# Patient Record
Sex: Female | Born: 1995 | Race: White | Hispanic: No | Marital: Single | Smoking: Former smoker
Health system: Southern US, Community
[De-identification: ages and names within clinical notes are randomized; demographics above are authoritative.]

## PROBLEM LIST (undated history)

## (undated) ENCOUNTER — Inpatient Hospital Stay (HOSPITAL_COMMUNITY): Payer: Self-pay

## (undated) DIAGNOSIS — Z8719 Personal history of other diseases of the digestive system: Secondary | ICD-10-CM

## (undated) DIAGNOSIS — K56609 Unspecified intestinal obstruction, unspecified as to partial versus complete obstruction: Secondary | ICD-10-CM

## (undated) DIAGNOSIS — S02609A Fracture of mandible, unspecified, initial encounter for closed fracture: Secondary | ICD-10-CM

## (undated) DIAGNOSIS — K449 Diaphragmatic hernia without obstruction or gangrene: Secondary | ICD-10-CM

## (undated) DIAGNOSIS — J45909 Unspecified asthma, uncomplicated: Secondary | ICD-10-CM

## (undated) DIAGNOSIS — F419 Anxiety disorder, unspecified: Secondary | ICD-10-CM

## (undated) DIAGNOSIS — S36113A Laceration of liver, unspecified degree, initial encounter: Secondary | ICD-10-CM

## (undated) DIAGNOSIS — F1193 Opioid use, unspecified with withdrawal: Secondary | ICD-10-CM

## (undated) DIAGNOSIS — Z8781 Personal history of (healed) traumatic fracture: Secondary | ICD-10-CM

## (undated) DIAGNOSIS — K567 Ileus, unspecified: Secondary | ICD-10-CM

## (undated) DIAGNOSIS — K529 Noninfective gastroenteritis and colitis, unspecified: Secondary | ICD-10-CM

## (undated) DIAGNOSIS — S0219XD Other fracture of base of skull, subsequent encounter for fracture with routine healing: Secondary | ICD-10-CM

## (undated) DIAGNOSIS — F1910 Other psychoactive substance abuse, uncomplicated: Secondary | ICD-10-CM

## (undated) DIAGNOSIS — S42412D Displaced simple supracondylar fracture without intercondylar fracture of left humerus, subsequent encounter for fracture with routine healing: Secondary | ICD-10-CM

## (undated) DIAGNOSIS — S069X9A Unspecified intracranial injury with loss of consciousness of unspecified duration, initial encounter: Secondary | ICD-10-CM

## (undated) DIAGNOSIS — S52602D Unspecified fracture of lower end of left ulna, subsequent encounter for closed fracture with routine healing: Secondary | ICD-10-CM

## (undated) DIAGNOSIS — S52272B Monteggia's fracture of left ulna, initial encounter for open fracture type I or II: Secondary | ICD-10-CM

## (undated) DIAGNOSIS — H539 Unspecified visual disturbance: Secondary | ICD-10-CM

## (undated) DIAGNOSIS — F4310 Post-traumatic stress disorder, unspecified: Secondary | ICD-10-CM

## (undated) DIAGNOSIS — J95821 Acute postprocedural respiratory failure: Secondary | ICD-10-CM

## (undated) DIAGNOSIS — H9191 Unspecified hearing loss, right ear: Secondary | ICD-10-CM

## (undated) DIAGNOSIS — K56600 Partial intestinal obstruction, unspecified as to cause: Secondary | ICD-10-CM

## (undated) DIAGNOSIS — S35229A Unspecified injury of superior mesenteric artery, initial encounter: Secondary | ICD-10-CM

## (undated) DIAGNOSIS — S20212A Contusion of left front wall of thorax, initial encounter: Secondary | ICD-10-CM

## (undated) HISTORY — PX: HIP SURGERY: SHX245

## (undated) HISTORY — PX: SPLENECTOMY, TOTAL: SHX788

## (undated) HISTORY — PX: FRACTURE SURGERY: SHX138

---
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
## 2016-01-05 DIAGNOSIS — S0219XD Other fracture of base of skull, subsequent encounter for fracture with routine healing: Secondary | ICD-10-CM | POA: Insufficient documentation

## 2016-01-05 DIAGNOSIS — S02609A Fracture of mandible, unspecified, initial encounter for closed fracture: Secondary | ICD-10-CM

## 2016-01-05 HISTORY — DX: Fracture of mandible, unspecified, initial encounter for closed fracture: S02.609A

## 2016-01-05 HISTORY — DX: Other fracture of base of skull, subsequent encounter for fracture with routine healing: S02.19XD

## 2016-01-13 DIAGNOSIS — S52602D Unspecified fracture of lower end of left ulna, subsequent encounter for closed fracture with routine healing: Secondary | ICD-10-CM

## 2016-01-13 DIAGNOSIS — S52272B Monteggia's fracture of left ulna, initial encounter for open fracture type I or II: Secondary | ICD-10-CM

## 2016-01-13 DIAGNOSIS — S42412D Displaced simple supracondylar fracture without intercondylar fracture of left humerus, subsequent encounter for fracture with routine healing: Secondary | ICD-10-CM | POA: Insufficient documentation

## 2016-01-13 HISTORY — DX: Monteggia's fracture of left ulna, initial encounter for open fracture type I or II: S52.272B

## 2016-01-13 HISTORY — DX: Displaced simple supracondylar fracture without intercondylar fracture of left humerus, subsequent encounter for fracture with routine healing: S42.412D

## 2016-01-13 HISTORY — DX: Unspecified fracture of lower end of left ulna, subsequent encounter for closed fracture with routine healing: S52.602D

## 2016-01-27 DIAGNOSIS — R4189 Other symptoms and signs involving cognitive functions and awareness: Secondary | ICD-10-CM | POA: Insufficient documentation

## 2016-01-27 DIAGNOSIS — R29818 Other symptoms and signs involving the nervous system: Secondary | ICD-10-CM | POA: Insufficient documentation

## 2016-02-07 HISTORY — PX: EXPLORATORY LAPAROTOMY: SUR591

## 2016-12-09 ENCOUNTER — Emergency Department (HOSPITAL_COMMUNITY)
Admission: EM | Admit: 2016-12-09 | Discharge: 2016-12-09 | Disposition: A | Discharge status: Other Acute Inpatient Hospital | Payer: Medicaid Other

## 2017-01-07 ENCOUNTER — Emergency Department (HOSPITAL_BASED_OUTPATIENT_CLINIC_OR_DEPARTMENT_OTHER)
Admission: EM | Admit: 2017-01-07 | Discharge: 2017-01-08 | Disposition: A | Discharge status: Home / Self Care | Payer: Medicaid Other | Attending: Emergency Medicine | Admitting: Emergency Medicine

## 2017-01-07 ENCOUNTER — Other Ambulatory Visit: Payer: Self-pay

## 2017-01-07 ENCOUNTER — Encounter (HOSPITAL_BASED_OUTPATIENT_CLINIC_OR_DEPARTMENT_OTHER): Payer: Self-pay | Admitting: Emergency Medicine

## 2017-01-07 DIAGNOSIS — F17210 Nicotine dependence, cigarettes, uncomplicated: Secondary | ICD-10-CM | POA: Diagnosis not present

## 2017-01-07 DIAGNOSIS — Z79899 Other long term (current) drug therapy: Secondary | ICD-10-CM | POA: Insufficient documentation

## 2017-01-07 DIAGNOSIS — M79602 Pain in left arm: Secondary | ICD-10-CM | POA: Diagnosis not present

## 2017-01-07 DIAGNOSIS — J45909 Unspecified asthma, uncomplicated: Secondary | ICD-10-CM | POA: Diagnosis not present

## 2017-01-07 NOTE — ED Triage Notes (Signed)
PT presents with c/o generalized body aches since Nov of last year

## 2017-01-07 NOTE — ED Notes (Signed)
EDPA into room, prior to RN assessment, see PA notes, pending orders.   

## 2017-01-08 MED ORDER — CYCLOBENZAPRINE HCL 10 MG PO TABS
10.0000 mg | ORAL_TABLET | Freq: Once | ORAL | Status: AC
  Administered 2017-01-08: 10 mg via ORAL
  Filled 2017-01-08: qty 1

## 2017-01-08 MED ORDER — CYCLOBENZAPRINE HCL 10 MG PO TABS: 10.0000 mg | ORAL_TABLET | Freq: Two times a day (BID) | ORAL | 0 refills | Status: DC | PRN

## 2017-01-08 MED ORDER — KETOROLAC TROMETHAMINE 60 MG/2ML IM SOLN
30.0000 mg | Freq: Once | INTRAMUSCULAR | Status: AC
  Administered 2017-01-08: 30 mg via INTRAMUSCULAR
  Filled 2017-01-08: qty 2

## 2017-01-08 NOTE — Discharge Instructions (Signed)
Please read and follow all provided instructions.  Your diagnoses today include:  1. Left arm pain     Tests performed today include:  Vital signs. See below for your results today.   Medications prescribed:   Flexeril (cyclobenzaprine) - muscle relaxer medication  DO NOT drive or perform any activities that require you to be awake and alert because this medicine can make you drowsy.   Take any prescribed medications only as directed.  Home care instructions:   Follow any educational materials contained in this packet  Follow R.I.C.E. Protocol:  R - rest your injury   I  - use ice on injury without applying directly to skin  C - compress injury with bandage or splint  E - elevate the injury as much as possible  Follow-up instructions: Please follow-up with your primary care provider as needed.   Return instructions:   Please return if your fingers are numb or tingling, appear gray or blue, or you have severe pain (also elevate the arm and loosen splint or wrap if you were given one)  Please return to the Emergency Department if you experience worsening symptoms.   Please return if you have any other emergent concerns.  Additional Information:  Your vital signs today were: BP 119/80 (BP Location: Left Arm)    Pulse 83    Temp 98.3 F (36.8 C) (Oral)    Resp 18    LMP 12/25/2016    SpO2 100%  If your blood pressure (BP) was elevated above 135/85 this visit, please have this repeated by your doctor within one month. --------------

## 2017-01-08 NOTE — ED Provider Notes (Signed)
[facility] EMERGENCY DEPARTMENT Provider Note   CSN: [id number] Arrival date & time: 01/07/17  2350     History   Chief Complaint Chief Complaint  Patient presents with  . Generalized Body Aches    HPI Deborah Boone is a 21 y.o. female.  Patient with history of traumatic left arm injury 1 year ago, several plates and screws placed in her arm, presents with acute on chronic pain worse over the past several days.  Patient relates the worsening pain to the cold weather.  She has generalized pain in her left shoulder, elbow, and wrist without swelling.  No new injuries.  No weakness, numbness or tingling.  Patient is taking over-the-counter medications at home, gabapentin, meloxicam without relief.  Nothing makes the pain better or worse.       Past Medical History:  Diagnosis Date  . Anxiety   . Asthma   . Hearing loss in right ear    Pt reports hearing loss in right ear  . PTSD (post-traumatic stress disorder)   . Small bowel obstruction (HCC)   . Vision abnormalities    Pt wears glasses    Patient Active Problem List   Diagnosis Date Noted  . Polysubstance dependence including opioid type drug without complication, episodic abuse (HCC) 09/01/2015  . MDD (major depressive disorder) 08/31/2015  . Ileus (HCC) 08/15/2015  . Partial small bowel obstruction 07/20/2015  . Anxiety   . Asthma   . Hearing loss in right ear   . Enteritis 07/18/2015  . Chronic post-traumatic stress disorder (PTSD) 03/14/2014  . Opioid use with withdrawal (HCC) 03/12/2014  . Polysubstance (including opioids) dependence with physiological dependence (HCC) 03/12/2014    History reviewed. No pertinent surgical history.  OB History    No data available       Home Medications    Prior to Admission medications   Medication Sig Start Date End Date Taking? Authorizing Provider  albuterol (PROVENTIL HFA;VENTOLIN HFA) 108 (90 Base) MCG/ACT inhaler Inhale 2 puffs into the lungs  every 4 (four) hours as needed for wheezing or shortness of breath. 09/08/15   Armandina StammerNwoko, Agnes I, NP  hydrOXYzine (ATARAX/VISTARIL) 25 MG tablet Take 1 tablet (25 mg total) by mouth every 6 (six) hours as needed for anxiety. 09/08/15   Armandina StammerNwoko, Agnes I, NP  lamoTRIgine (LAMICTAL) 25 MG tablet Take 1 tablet (25 mg total) by mouth daily. Mood stabilization 09/08/15   Nwoko, Nicole KindredAgnes I, NP  nicotine (NICODERM CQ - DOSED IN MG/24 HOURS) 21 mg/24hr patch Place 1 patch (21 mg total) onto the skin daily. For smoking cessation 09/08/15   Armandina StammerNwoko, Agnes I, NP  traZODone (DESYREL) 50 MG tablet Take 1 tablet (50 mg total) by mouth at bedtime as needed for sleep. 09/08/15   Sanjuana KavaNwoko, Agnes I, NP    Family History No family history on file.  Social History Social History   Tobacco Use  . Smoking status: Current Every Day Smoker    Packs/day: 1.00    Years: 5.00    Pack years: 5.00    Types: Cigarettes  . Smokeless tobacco: Never Used  Substance Use Topics  . Alcohol use: Yes    Comment: 4-5X PER WEEK  . Drug use: Yes    Types: Cocaine, Heroin, Hydrocodone, Oxycodone, Marijuana, Benzodiazepines, Other-see comments, Methamphetamines    Comment: heroine, xanax, crack     Allergies   Patient has no known allergies.   Review of Systems Review of Systems  Constitutional: Negative for  activity change.  Musculoskeletal: Positive for arthralgias and myalgias. Negative for back pain, joint swelling and neck pain.  Skin: Negative for wound.  Neurological: Negative for weakness and numbness.     Physical Exam Updated Vital Signs BP 119/80 (BP Location: Left Arm)   Pulse 83   Temp 98.3 F (36.8 C) (Oral)   Resp 18   LMP 12/25/2016   SpO2 100%   Physical Exam  Constitutional: She appears well-developed and well-nourished.  HENT:  Head: Normocephalic and atraumatic.  Eyes: Conjunctivae are normal.  Neck: Normal range of motion. Neck supple.  Pulmonary/Chest: No respiratory distress.  Musculoskeletal:    Patient with mild generalized tenderness of the left upper extremity.  Postsurgical changes noted.  Full range of motion of the hand, wrist, elbow, shoulder.  No effusions or cellulitis noted.  2+ radial pulse.  Normal capillary refill.  Distal sensation is normal.  No tenderness or swelling over the cervical or thoracic spines, upper back.  Neurological: She is alert.  Skin: Skin is warm and dry.  Psychiatric: She has a normal mood and affect.  Nursing note and vitals reviewed.    ED Treatments / Results   Procedures Procedures (including critical care time)  Medications Ordered in ED Medications  ketorolac (TORADOL) injection 30 mg (not administered)  cyclobenzaprine (FLEXERIL) tablet 10 mg (not administered)     Initial Impression / Assessment and Plan / ED Course  I have reviewed the triage vital signs and the nursing notes.  Pertinent labs & imaging results that were available during my care of the patient were reviewed by me and considered in my medical decision making (see chart for details).     Patient seen and examined. No acute injuries, infections, or other emergent problems noted on exam.  Upper extremities neurovascularly intact.  Will give IM Toradol and a muscle relaxer as the patient describes some spasming in her arm at times.  Patient counseled on proper use of muscle relaxant medication.  They were told not to drink alcohol, drive any vehicle, or do any dangerous activities while taking this medication.  Patient verbalized understanding.  Vital signs reviewed and are as follows: BP 119/80 (BP Location: Left Arm)   Pulse 83   Temp 98.3 F (36.8 C) (Oral)   Resp 18   LMP 12/25/2016   SpO2 100%     Final Clinical Impressions(s) / ED Diagnoses   Final diagnoses:  Left arm pain   Patient with acute on chronic left arm pain.  Treated with anti-inflammatories and muscle relaxer here.  Home with muscle relaxer.  She has meloxicam at home.  Encouraged  follow-up with her primary care physician as needed.  No signs of ischemia, infection, effusion on exam here.  Good range of motion.  Do not suspect joint infection.  ED Discharge Orders        Ordered    cyclobenzaprine (FLEXERIL) 10 MG tablet  2 times daily PRN     01/08/17 0035       Renne CriglerGeiple, Destinie Thornsberry, PA-C 01/08/17 0037    Molpus, Jonny RuizJohn, MD 01/08/17 [phone]

## 2017-01-08 NOTE — ED Notes (Signed)
Alert, NAD, calm, interactive, resps e/u, speaking in clear complete sentences, no dyspnea noted, skin W&D, initial VSS, c/o chronic L sided arm pain, and general L sided aches, for ~ 2 years since accident involving surgery and hardware, mentions some tingling r/t nerve damage (denies: recent sickness, sob, NVD, fever, dizziness or visual changes). [facility] H/o surgery and PT.

## 2017-01-08 NOTE — ED Notes (Signed)
Pt given d/c instructions as per chart. Rx x 1 with precautions. Verbalizes understanding. No questions. 

## 2017-12-21 IMAGING — CT CT ABD-PELV W/ CM
2 of 4 series · 15 of 46 positions shown, 17 images · IV contrast (APPLIED)
Comparison: 07/17/2015

CLINICAL DATA: Gradual onset right lower quadrant of abdominal pain
and right flank pain for 1 week. Fever, dysuria, hematuria,
dizziness, right chest pain.

EXAM:
CT ABDOMEN AND PELVIS WITH CONTRAST
TECHNIQUE: Multidetector CT imaging of the abdomen and pelvis was performed
using the standard protocol following bolus administration of
intravenous contrast.
CONTRAST:  100mL K0OYAI-YBB IOPAMIDOL (K0OYAI-YBB) INJECTION 61%

[Series 2: axial st · axial · 0.68mm/px · z∈[-582,-212]mm · 12 of 88 slices shown, 14 images]
[im 7/88  soft-tissue]
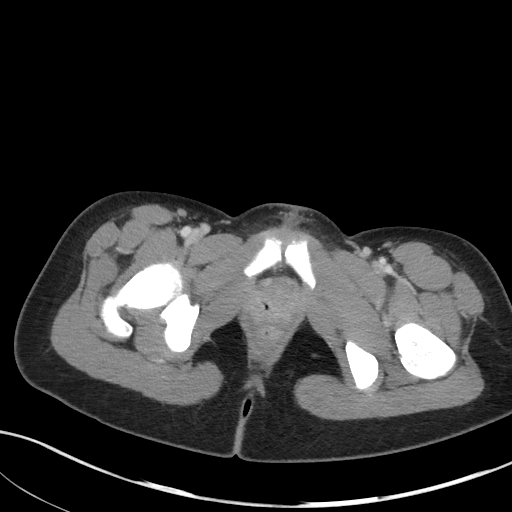
[im 7/88  bone]
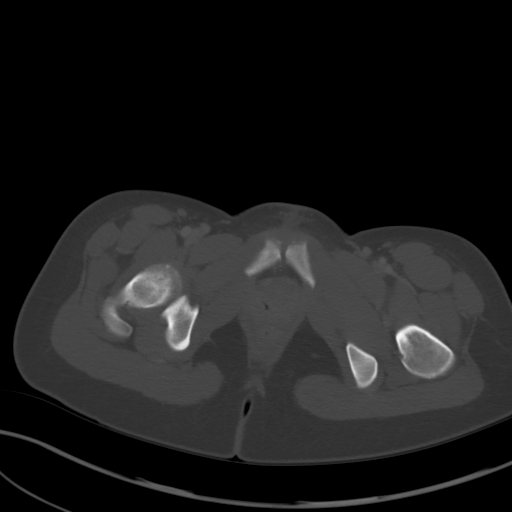
[im 14/88  soft-tissue]
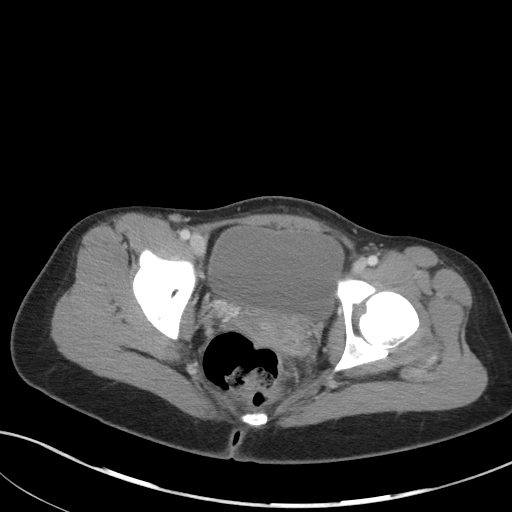
[im 21/88  soft-tissue]
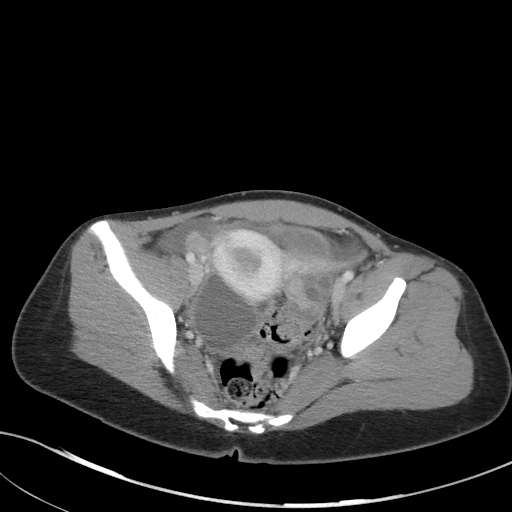
[im 27/88  soft-tissue]
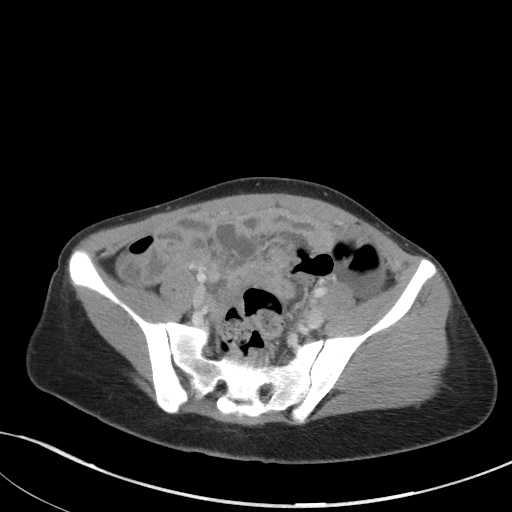
[im 34/88  soft-tissue]
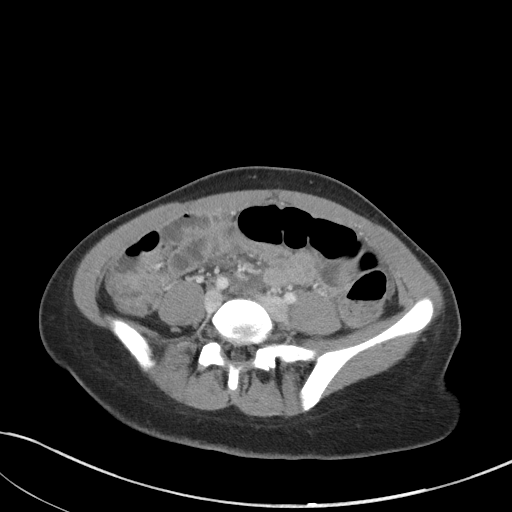
[im 41/88  soft-tissue]
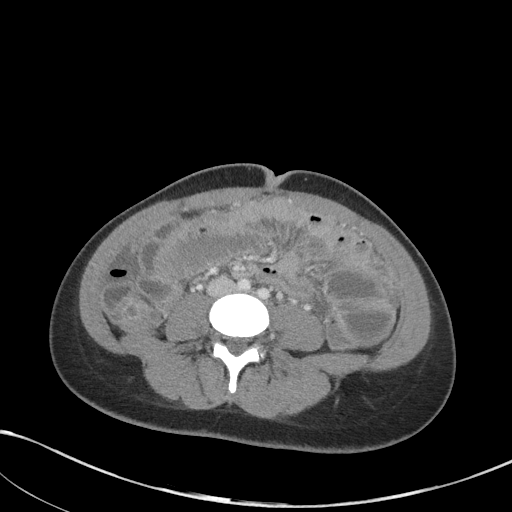
[im 47/88  soft-tissue]
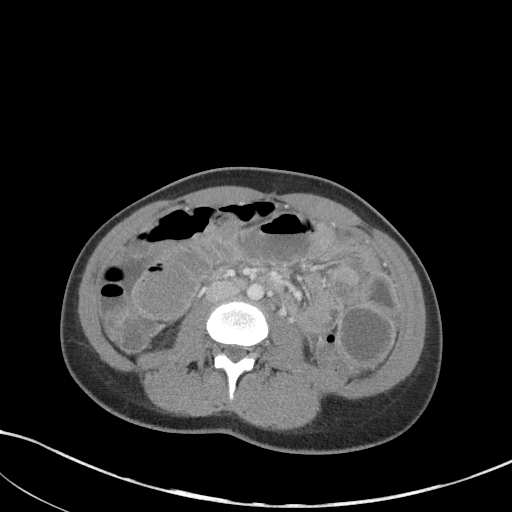
[im 54/88  soft-tissue]
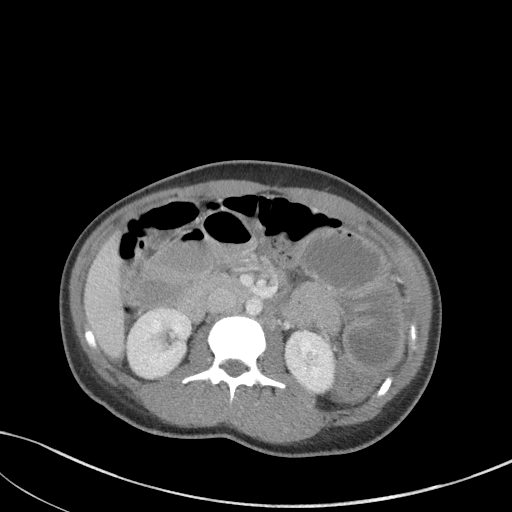
[im 61/88  soft-tissue]
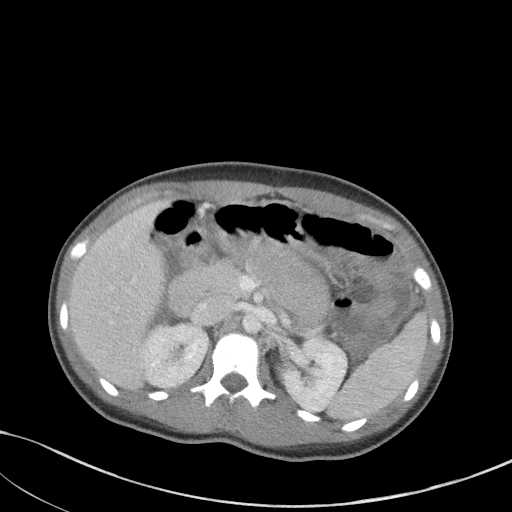
[im 61/88  bone]
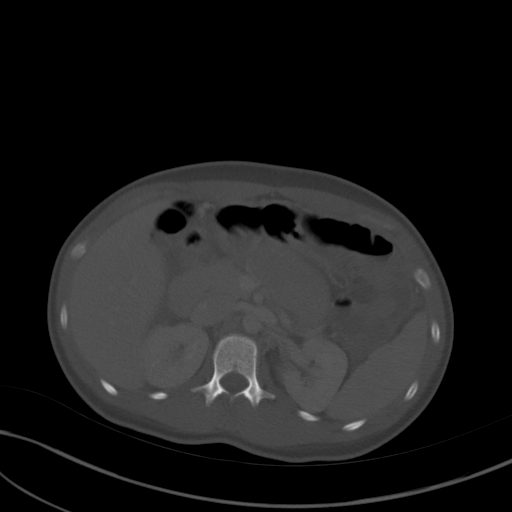
[im 67/88  soft-tissue]
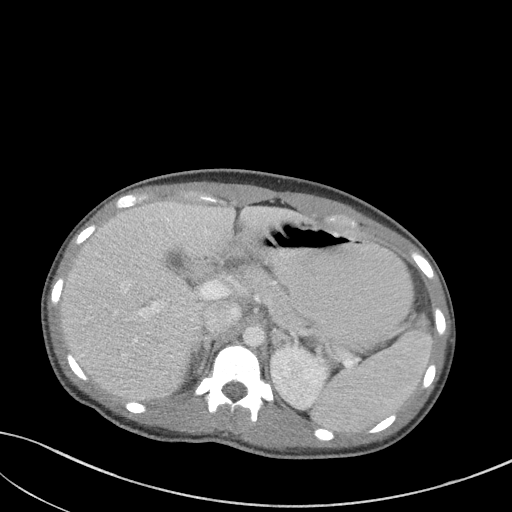
[im 74/88  soft-tissue]
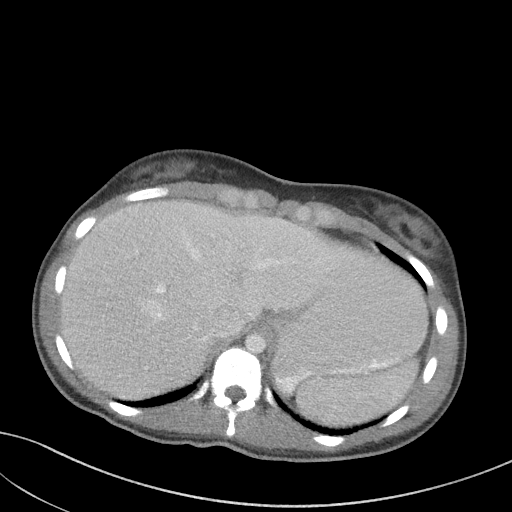
[im 81/88  soft-tissue]
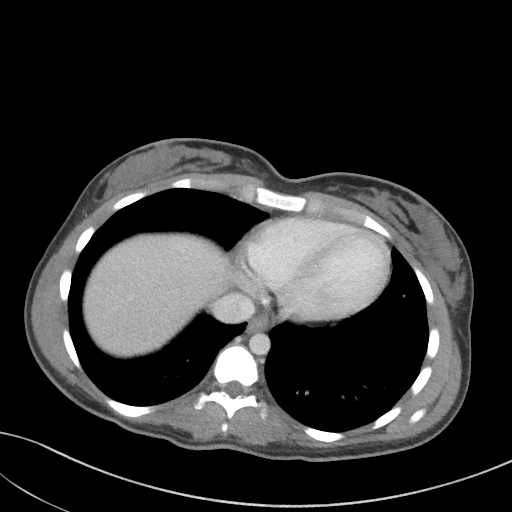

[Series 5: coronal st · coronal · 0.55mm/px · 3 of 77 slices shown]
[im 26/77  soft-tissue]
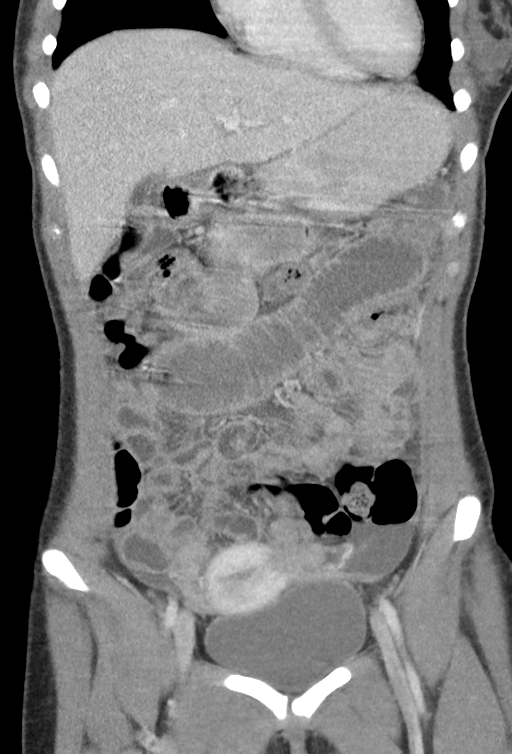
[im 34/77  soft-tissue]
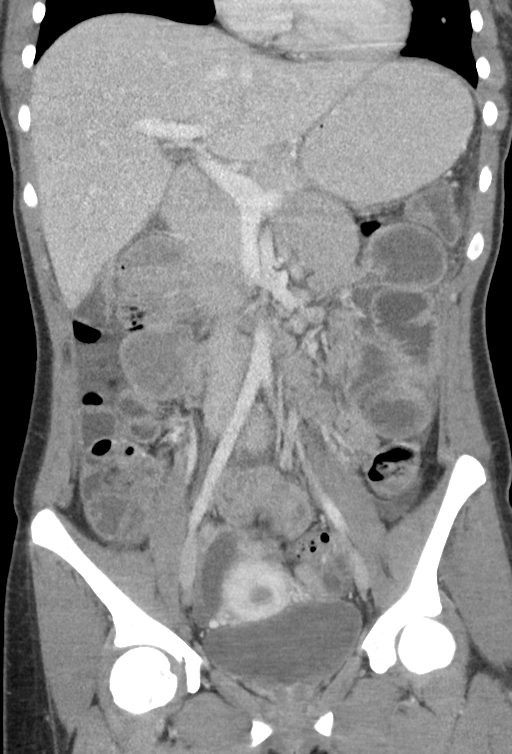
[im 43/77  soft-tissue]
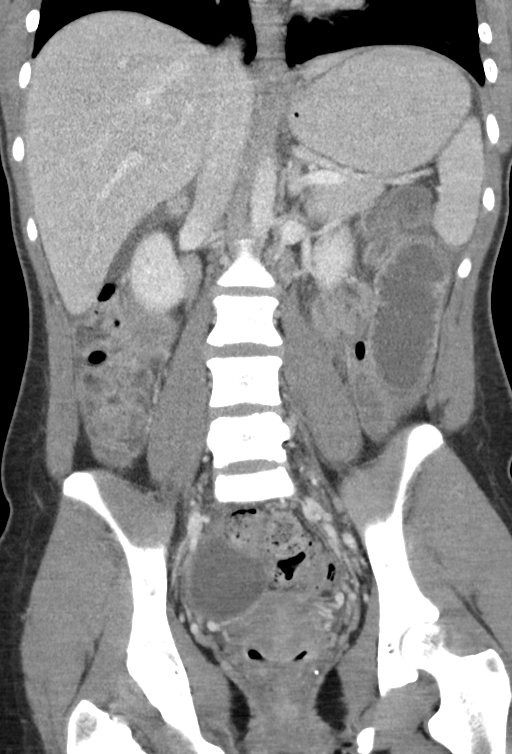

[15 of 46 positions shown; findings below may reference images not displayed]

FINDINGS: The lung bases are clear.

The liver, spleen, gallbladder, pancreas, adrenal glands, kidneys,
abdominal aorta, inferior vena cava are unremarkable. Mild
prominence of retroperitoneal lymph nodes, likely reactive. Stomach
is not abnormally distended. There is diffuse distention of
fluid-filled small bowel with decompression of the ileum. Transition
zone is nonspecific but probably in the left upper quadrant. There
is evidence of some wall thickening in the dilated small bowel and
there is free fluid in the mesenteric. Findings could represent
superimposed enteritis. Ischemia possible but less likely as there
is no pneumatosis or portal venous gas. Cause of obstruction is not
identified. No free air in the abdomen.

Pelvis: Bladder wall is not thickened. Uterus is not enlarged. There
is a right ovarian cyst measuring 4.7 cm diameter. This appears to
be a simple cyst. Based on size and premenopausal age, no follow-up
is needed. No free or loculated pelvic fluid collections. No pelvic
mass or lymphadenopathy. Bold no destructive bone lesions.
IMPRESSION: Prominent distended fluid-filled small bowel with wall thickening.
Decompression of the ileum. Mild mesenteric edema. Changes are
consistent with small bowel obstruction and enteritis. Cause of
obstruction is not identified. Transition zone appears to be in the
left abdomen.

## 2017-12-24 IMAGING — DX DG ABDOMEN 2V
2 series · 2 of 2 positions shown · non-contrast
Comparison: 07/20/2015

CLINICAL DATA: Small-bowel obstruction subsequent encounter

EXAM:
ABDOMEN - 2 VIEW

[abdomen erect]
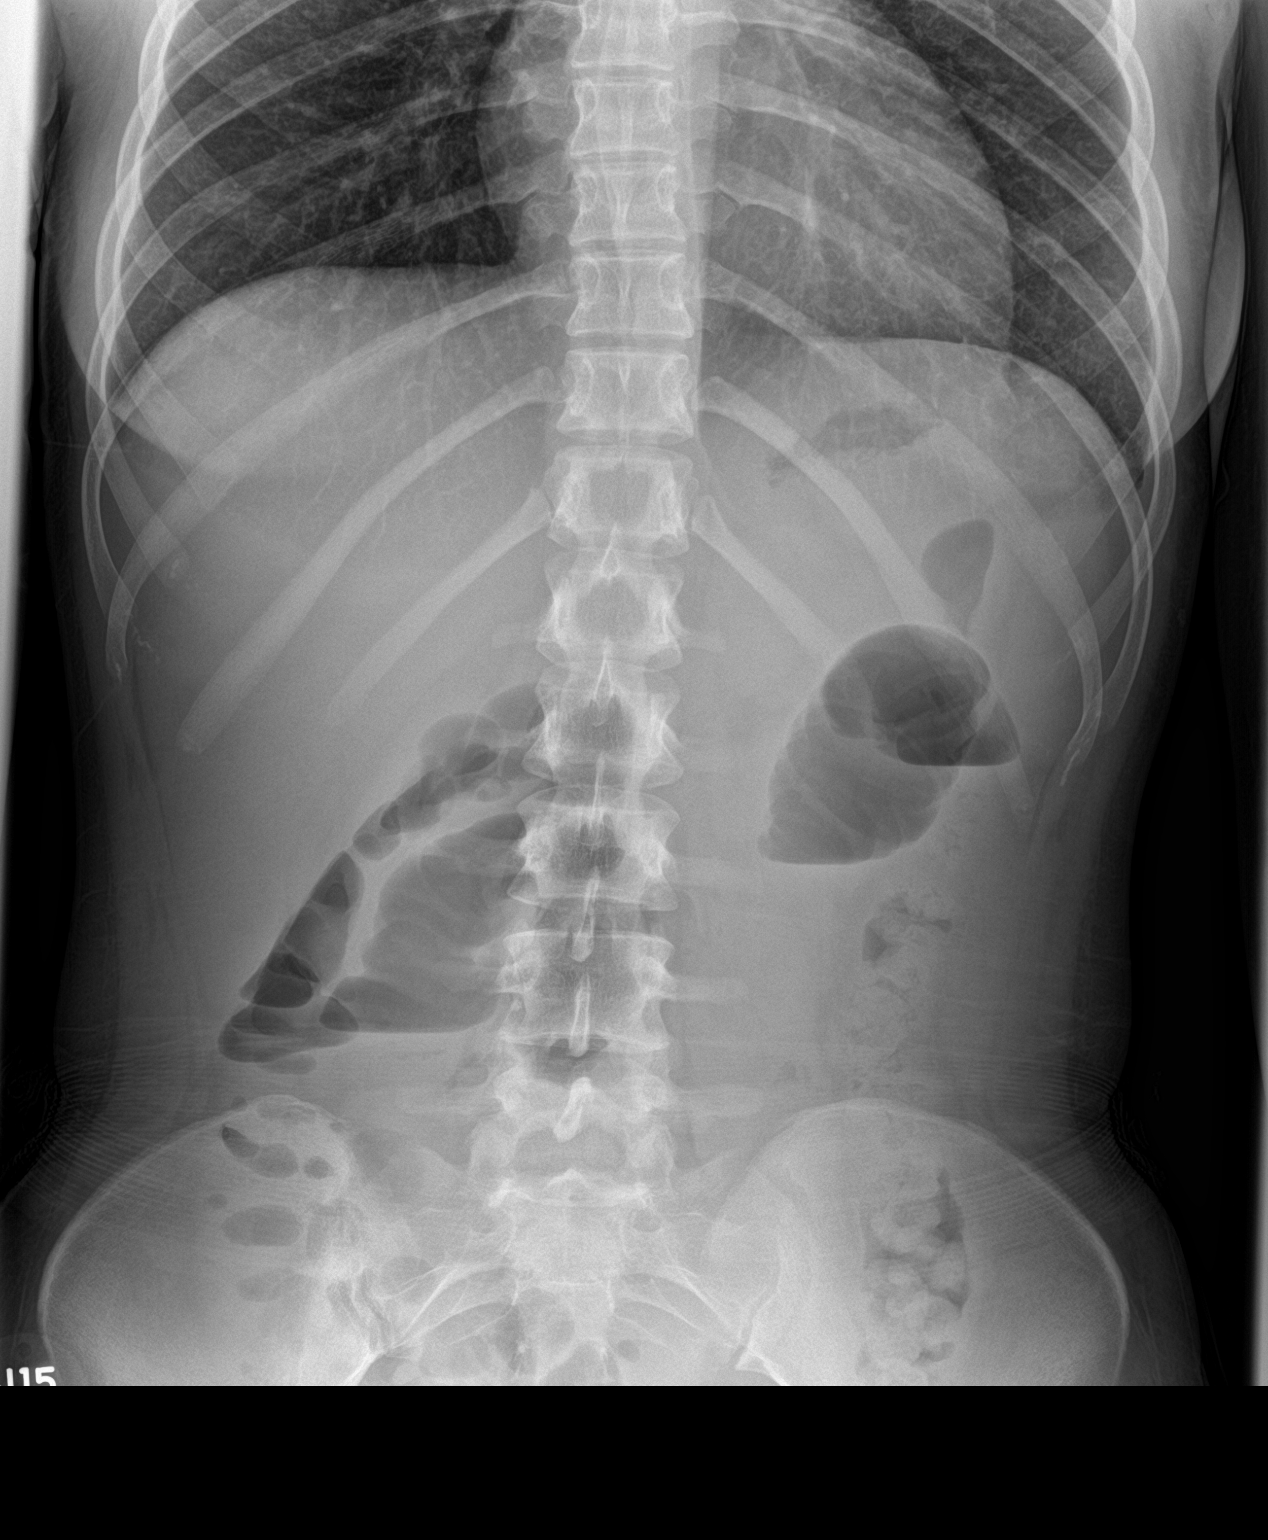

[abdomen supine]
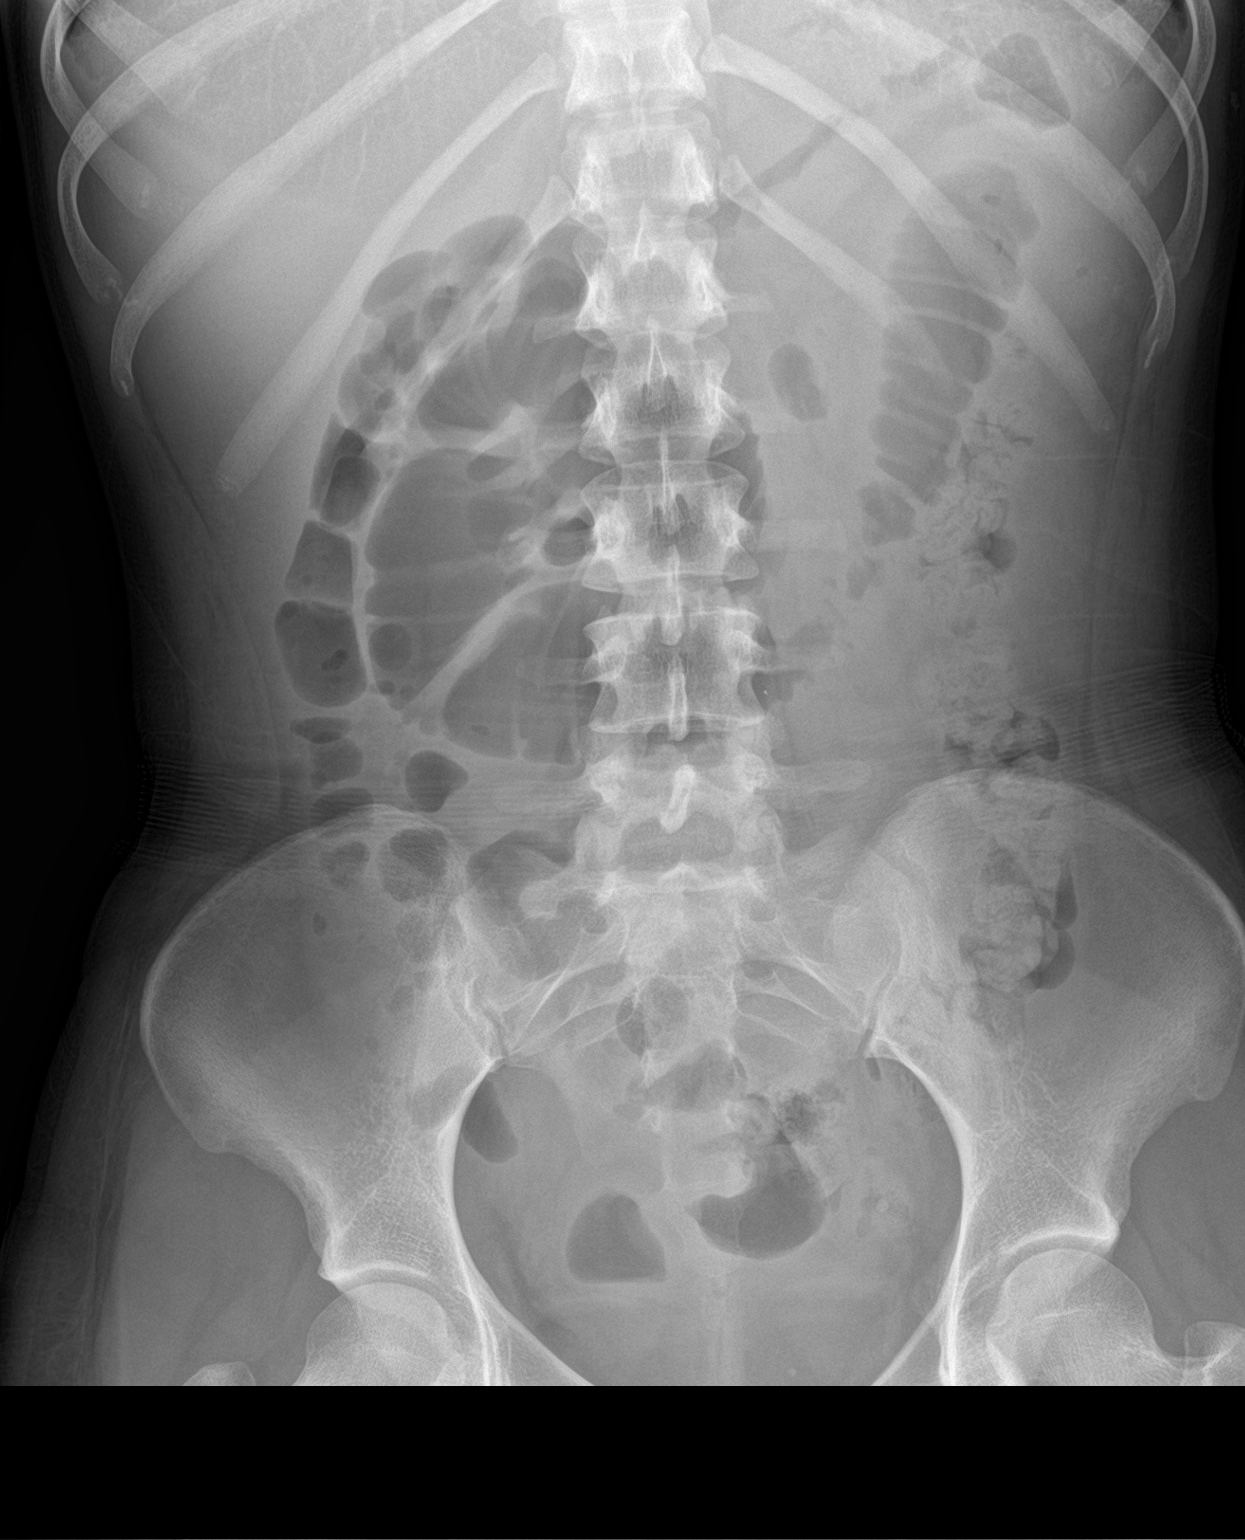

[2 of 2 positions shown; findings below may reference images not displayed]

FINDINGS: No evidence of free air. Small bowel air-fluid levels again
identified. Also again identified is the presence of dilatation
involving numerous loops of mid abdominal small bowel. Maximum
diameter involves a loop in the left upper quadrant in measures
cm. The colon is decompressed. There is a small volume of gas into
the large bowel.
IMPRESSION: Persistent small bowel dilatation with air-fluid levels consistent
with small bowel obstruction

## 2018-01-25 ENCOUNTER — Emergency Department (HOSPITAL_COMMUNITY)
Admission: EM | Admit: 2018-01-25 | Discharge: 2018-01-26 | Disposition: A | Discharge status: Other Acute Inpatient Hospital | Payer: Medicaid Other | Source: Home / Self Care | Attending: Emergency Medicine | Admitting: Emergency Medicine

## 2018-01-25 ENCOUNTER — Emergency Department (HOSPITAL_COMMUNITY)
Admission: EM | Admit: 2018-01-25 | Discharge: 2018-01-25 | Disposition: A | Discharge status: Home / Self Care | Payer: Medicaid Other | Attending: Emergency Medicine | Admitting: Emergency Medicine

## 2018-01-25 ENCOUNTER — Encounter (HOSPITAL_COMMUNITY): Payer: Self-pay | Admitting: Emergency Medicine

## 2018-01-25 ENCOUNTER — Emergency Department (HOSPITAL_COMMUNITY): Payer: Medicaid Other

## 2018-01-25 ENCOUNTER — Other Ambulatory Visit: Payer: Self-pay

## 2018-01-25 DIAGNOSIS — F17210 Nicotine dependence, cigarettes, uncomplicated: Secondary | ICD-10-CM

## 2018-01-25 DIAGNOSIS — E860 Dehydration: Secondary | ICD-10-CM | POA: Diagnosis not present

## 2018-01-25 DIAGNOSIS — O2391 Unspecified genitourinary tract infection in pregnancy, first trimester: Secondary | ICD-10-CM | POA: Insufficient documentation

## 2018-01-25 DIAGNOSIS — F1520 Other stimulant dependence, uncomplicated: Secondary | ICD-10-CM | POA: Insufficient documentation

## 2018-01-25 DIAGNOSIS — F1120 Opioid dependence, uncomplicated: Secondary | ICD-10-CM

## 2018-01-25 DIAGNOSIS — F1220 Cannabis dependence, uncomplicated: Secondary | ICD-10-CM

## 2018-01-25 DIAGNOSIS — O9989 Other specified diseases and conditions complicating pregnancy, childbirth and the puerperium: Secondary | ICD-10-CM | POA: Diagnosis present

## 2018-01-25 DIAGNOSIS — O99331 Smoking (tobacco) complicating pregnancy, first trimester: Secondary | ICD-10-CM | POA: Insufficient documentation

## 2018-01-25 DIAGNOSIS — J45909 Unspecified asthma, uncomplicated: Secondary | ICD-10-CM | POA: Diagnosis not present

## 2018-01-25 DIAGNOSIS — R55 Syncope and collapse: Secondary | ICD-10-CM | POA: Diagnosis not present

## 2018-01-25 DIAGNOSIS — Y929 Unspecified place or not applicable: Secondary | ICD-10-CM | POA: Insufficient documentation

## 2018-01-25 DIAGNOSIS — O99511 Diseases of the respiratory system complicating pregnancy, first trimester: Secondary | ICD-10-CM

## 2018-01-25 DIAGNOSIS — R8271 Bacteriuria: Secondary | ICD-10-CM

## 2018-01-25 DIAGNOSIS — Y939 Activity, unspecified: Secondary | ICD-10-CM | POA: Diagnosis not present

## 2018-01-25 DIAGNOSIS — S0990XA Unspecified injury of head, initial encounter: Secondary | ICD-10-CM | POA: Diagnosis not present

## 2018-01-25 DIAGNOSIS — F1420 Cocaine dependence, uncomplicated: Secondary | ICD-10-CM | POA: Insufficient documentation

## 2018-01-25 DIAGNOSIS — O99519 Diseases of the respiratory system complicating pregnancy, unspecified trimester: Secondary | ICD-10-CM | POA: Insufficient documentation

## 2018-01-25 DIAGNOSIS — O99321 Drug use complicating pregnancy, first trimester: Secondary | ICD-10-CM | POA: Insufficient documentation

## 2018-01-25 DIAGNOSIS — O99341 Other mental disorders complicating pregnancy, first trimester: Secondary | ICD-10-CM | POA: Insufficient documentation

## 2018-01-25 DIAGNOSIS — F1910 Other psychoactive substance abuse, uncomplicated: Secondary | ICD-10-CM

## 2018-01-25 DIAGNOSIS — O99320 Drug use complicating pregnancy, unspecified trimester: Secondary | ICD-10-CM | POA: Diagnosis not present

## 2018-01-25 DIAGNOSIS — F332 Major depressive disorder, recurrent severe without psychotic features: Secondary | ICD-10-CM

## 2018-01-25 DIAGNOSIS — Y999 Unspecified external cause status: Secondary | ICD-10-CM | POA: Insufficient documentation

## 2018-01-25 DIAGNOSIS — Z3A01 Less than 8 weeks gestation of pregnancy: Secondary | ICD-10-CM | POA: Insufficient documentation

## 2018-01-25 DIAGNOSIS — F17290 Nicotine dependence, other tobacco product, uncomplicated: Secondary | ICD-10-CM | POA: Insufficient documentation

## 2018-01-25 DIAGNOSIS — R45851 Suicidal ideations: Secondary | ICD-10-CM

## 2018-01-25 DIAGNOSIS — M542 Cervicalgia: Secondary | ICD-10-CM | POA: Insufficient documentation

## 2018-01-25 DIAGNOSIS — O99330 Smoking (tobacco) complicating pregnancy, unspecified trimester: Secondary | ICD-10-CM | POA: Insufficient documentation

## 2018-01-25 HISTORY — DX: Other psychoactive substance abuse, uncomplicated: F19.10

## 2018-01-25 LAB — CBC WITH DIFFERENTIAL/PLATELET
Abs Immature Granulocytes: 0.05 10*3/uL (ref 0.00–0.07)
Abs Immature Granulocytes: 0.12 10*3/uL — ABNORMAL HIGH (ref 0.00–0.07)
BASOS PCT: 0 %
Basophils Absolute: 0 10*3/uL (ref 0.0–0.1)
Basophils Absolute: 0.1 10*3/uL (ref 0.0–0.1)
Basophils Relative: 0 %
EOS PCT: 1 %
Eosinophils Absolute: 0.2 10*3/uL (ref 0.0–0.5)
Eosinophils Absolute: 0.2 10*3/uL (ref 0.0–0.5)
Eosinophils Relative: 1 %
HCT: 37.4 % (ref 36.0–46.0)
HCT: 35 % — ABNORMAL LOW (ref 36.0–46.0)
HEMOGLOBIN: 11.5 g/dL — AB (ref 12.0–15.0)
Hemoglobin: 10.8 g/dL — ABNORMAL LOW (ref 12.0–15.0)
IMMATURE GRANULOCYTES: 1 %
Immature Granulocytes: 0 %
LYMPHS PCT: 29 %
Lymphocytes Relative: 29 %
Lymphs Abs: 4.8 10*3/uL — ABNORMAL HIGH (ref 0.7–4.0)
Lymphs Abs: 5.6 10*3/uL — ABNORMAL HIGH (ref 0.7–4.0)
MCH: 23.8 pg — AB (ref 26.0–34.0)
MCH: 24.2 pg — ABNORMAL LOW (ref 26.0–34.0)
MCHC: 30.7 g/dL (ref 30.0–36.0)
MCHC: 30.9 g/dL (ref 30.0–36.0)
MCV: 77.3 fL — AB (ref 80.0–100.0)
MCV: 78.5 fL — ABNORMAL LOW (ref 80.0–100.0)
MONO ABS: 1.6 10*3/uL — AB (ref 0.1–1.0)
MONOS PCT: 10 %
Monocytes Absolute: 1.7 10*3/uL — ABNORMAL HIGH (ref 0.1–1.0)
Monocytes Relative: 9 %
NEUTROS PCT: 60 %
Neutro Abs: 9.8 10*3/uL — ABNORMAL HIGH (ref 1.7–7.7)
Neutro Abs: 11.4 10*3/uL — ABNORMAL HIGH (ref 1.7–7.7)
Neutrophils Relative %: 60 %
Platelets: 621 10*3/uL — ABNORMAL HIGH (ref 150–400)
Platelets: 567 10*3/uL — ABNORMAL HIGH (ref 150–400)
RBC: 4.84 MIL/uL (ref 3.87–5.11)
RBC: 4.46 MIL/uL (ref 3.87–5.11)
RDW: 18.9 % — ABNORMAL HIGH (ref 11.5–15.5)
RDW: 18.9 % — ABNORMAL HIGH (ref 11.5–15.5)
WBC: 16.5 10*3/uL — AB (ref 4.0–10.5)
WBC: 19.1 10*3/uL — ABNORMAL HIGH (ref 4.0–10.5)
nRBC: 0 % (ref 0.0–0.2)
nRBC: 0 % (ref 0.0–0.2)

## 2018-01-25 LAB — RAPID URINE DRUG SCREEN, HOSP PERFORMED
Amphetamines: POSITIVE — AB
Amphetamines: POSITIVE — AB
Barbiturates: NOT DETECTED
Barbiturates: NOT DETECTED
Benzodiazepines: NOT DETECTED
Benzodiazepines: NOT DETECTED
COCAINE: POSITIVE — AB
Cocaine: POSITIVE — AB
Opiates: POSITIVE — AB
Opiates: POSITIVE — AB
Tetrahydrocannabinol: POSITIVE — AB
Tetrahydrocannabinol: POSITIVE — AB

## 2018-01-25 LAB — URINALYSIS, ROUTINE W REFLEX MICROSCOPIC
BACTERIA UA: NONE SEEN
BILIRUBIN URINE: NEGATIVE
Bilirubin Urine: NEGATIVE
Glucose, UA: NEGATIVE mg/dL
Glucose, UA: NEGATIVE mg/dL
Hgb urine dipstick: NEGATIVE
KETONES UR: 5 mg/dL — AB
Ketones, ur: NEGATIVE mg/dL
Nitrite: NEGATIVE
Nitrite: NEGATIVE
PH: 5 (ref 5.0–8.0)
PROTEIN: 30 mg/dL — AB
Protein, ur: NEGATIVE mg/dL
Specific Gravity, Urine: 1.031 — ABNORMAL HIGH (ref 1.005–1.030)
Specific Gravity, Urine: 1.024 (ref 1.005–1.030)
WBC, UA: >50 WBC/hpf — ABNORMAL HIGH (ref 0–5)
pH: 5 (ref 5.0–8.0)

## 2018-01-25 LAB — ETHANOL

## 2018-01-25 LAB — COMPREHENSIVE METABOLIC PANEL
ALT: 11 U/L (ref 0–44)
AST: 16 U/L (ref 15–41)
Albumin: 3.7 g/dL (ref 3.5–5.0)
Alkaline Phosphatase: 55 U/L (ref 38–126)
Anion gap: 7 (ref 5–15)
BUN: 10 mg/dL (ref 6–20)
CHLORIDE: 108 mmol/L (ref 98–111)
CO2: 22 mmol/L (ref 22–32)
Calcium: 9.3 mg/dL (ref 8.9–10.3)
Creatinine, Ser: 0.54 mg/dL (ref 0.44–1.00)
GFR calc Af Amer: >60 mL/min (ref >60)
GFR calc non Af Amer: >60 mL/min (ref >60)
Glucose, Bld: 94 mg/dL (ref 70–99)
Potassium: 3.5 mmol/L (ref 3.5–5.1)
Sodium: 137 mmol/L (ref 135–145)
Total Bilirubin: 0.5 mg/dL (ref 0.3–1.2)
Total Protein: 7 g/dL (ref 6.5–8.1)

## 2018-01-25 LAB — BASIC METABOLIC PANEL
Anion gap: 8 (ref 5–15)
BUN: 15 mg/dL (ref 6–20)
CHLORIDE: 104 mmol/L (ref 98–111)
CO2: 24 mmol/L (ref 22–32)
CREATININE: 0.65 mg/dL (ref 0.44–1.00)
Calcium: 9.4 mg/dL (ref 8.9–10.3)
GFR calc Af Amer: >60 mL/min (ref >60)
GFR calc non Af Amer: >60 mL/min (ref >60)
GLUCOSE: 103 mg/dL — AB (ref 70–99)
Potassium: 3.4 mmol/L — ABNORMAL LOW (ref 3.5–5.1)
SODIUM: 136 mmol/L (ref 135–145)

## 2018-01-25 LAB — SALICYLATE LEVEL: Salicylate Lvl: <7 mg/dL (ref 2.8–30.0)

## 2018-01-25 LAB — ACETAMINOPHEN LEVEL: Acetaminophen (Tylenol), Serum: <10 ug/mL — ABNORMAL LOW (ref 10–30)

## 2018-01-25 LAB — I-STAT BETA HCG BLOOD, ED (MC, WL, AP ONLY): I-stat hCG, quantitative: >2000 m[IU]/mL — ABNORMAL HIGH (ref <5)

## 2018-01-25 LAB — HCG, QUANTITATIVE, PREGNANCY: hCG, Beta Chain, Quant, S: 75303 m[IU]/mL — ABNORMAL HIGH (ref <5)

## 2018-01-25 MED ORDER — PRENATAL COMPLETE 14-0.4 MG PO TABS: 1.0000 | ORAL_TABLET | Freq: Every day | ORAL | 0 refills | Status: DC

## 2018-01-25 MED ORDER — CEPHALEXIN 500 MG PO CAPS
500.0000 mg | ORAL_CAPSULE | Freq: Once | ORAL | Status: AC
  Administered 2018-01-25: 500 mg via ORAL
  Filled 2018-01-25: qty 1

## 2018-01-25 MED ORDER — CEPHALEXIN 500 MG PO CAPS
500.0000 mg | ORAL_CAPSULE | Freq: Two times a day (BID) | ORAL | Status: DC
  Administered 2018-01-26: 500 mg via ORAL
  Filled 2018-01-25: qty 1

## 2018-01-25 MED ORDER — SODIUM CHLORIDE 0.9 % IV BOLUS
1000.0000 mL | Freq: Once | INTRAVENOUS | Status: AC
  Administered 2018-01-25: 1000 mL via INTRAVENOUS

## 2018-01-25 NOTE — ED Notes (Signed)
Pt called to be triaged with no response.  RN notified. 
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
## 2018-01-25 NOTE — ED Notes (Signed)
Per PA, draw cbc with diff in a few hours after antibiotic

## 2018-01-25 NOTE — ED Triage Notes (Signed)
Pt would like to be evaluated for drug detox. Pt was eval last evening and d/c with resources, but pt returns because she found out yesterday she is pregnant and has used heroin and crack today approximately 5 hours ago.
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
## 2018-01-25 NOTE — ED Notes (Signed)
Pt. Made aware for the need of urine specimen. 

## 2018-01-25 NOTE — ED Notes (Signed)
Patient aware a urine sample is needed. 

## 2018-01-25 NOTE — ED Notes (Signed)
Pt changed into scrubs, items placed into pt belonging bag and put in cabinet at nursing station. Bag includes wallet, phone, charger, sweatpants and top. Will be given to dad when he leaves.
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
## 2018-01-26 ENCOUNTER — Other Ambulatory Visit: Payer: Self-pay

## 2018-01-26 ENCOUNTER — Encounter (HOSPITAL_COMMUNITY): Payer: Self-pay | Admitting: Behavioral Health

## 2018-01-26 ENCOUNTER — Inpatient Hospital Stay (HOSPITAL_COMMUNITY)
Admission: AD | Admit: 2018-01-26 | Discharge: 2018-01-31 | DRG: 832 | Disposition: A | Discharge status: Home / Self Care | Payer: Medicaid Other | Source: Intra-hospital | Attending: Psychiatry | Admitting: Psychiatry

## 2018-01-26 DIAGNOSIS — F1123 Opioid dependence with withdrawal: Secondary | ICD-10-CM | POA: Diagnosis present

## 2018-01-26 DIAGNOSIS — F1193 Opioid use, unspecified with withdrawal: Secondary | ICD-10-CM | POA: Diagnosis present

## 2018-01-26 DIAGNOSIS — F419 Anxiety disorder, unspecified: Secondary | ICD-10-CM

## 2018-01-26 DIAGNOSIS — F1410 Cocaine abuse, uncomplicated: Secondary | ICD-10-CM | POA: Diagnosis present

## 2018-01-26 DIAGNOSIS — G4700 Insomnia, unspecified: Secondary | ICD-10-CM | POA: Diagnosis present

## 2018-01-26 DIAGNOSIS — J45909 Unspecified asthma, uncomplicated: Secondary | ICD-10-CM | POA: Diagnosis present

## 2018-01-26 DIAGNOSIS — F17210 Nicotine dependence, cigarettes, uncomplicated: Secondary | ICD-10-CM | POA: Diagnosis present

## 2018-01-26 DIAGNOSIS — F1510 Other stimulant abuse, uncomplicated: Secondary | ICD-10-CM | POA: Diagnosis present

## 2018-01-26 DIAGNOSIS — Z590 Homelessness: Secondary | ICD-10-CM

## 2018-01-26 DIAGNOSIS — O99320 Drug use complicating pregnancy, unspecified trimester: Secondary | ICD-10-CM | POA: Diagnosis present

## 2018-01-26 DIAGNOSIS — F1420 Cocaine dependence, uncomplicated: Secondary | ICD-10-CM | POA: Diagnosis not present

## 2018-01-26 DIAGNOSIS — F1124 Opioid dependence with opioid-induced mood disorder: Secondary | ICD-10-CM | POA: Diagnosis not present

## 2018-01-26 DIAGNOSIS — F410 Panic disorder [episodic paroxysmal anxiety] without agoraphobia: Secondary | ICD-10-CM | POA: Diagnosis present

## 2018-01-26 DIAGNOSIS — F1210 Cannabis abuse, uncomplicated: Secondary | ICD-10-CM | POA: Diagnosis present

## 2018-01-26 DIAGNOSIS — H9191 Unspecified hearing loss, right ear: Secondary | ICD-10-CM | POA: Diagnosis present

## 2018-01-26 DIAGNOSIS — O2340 Unspecified infection of urinary tract in pregnancy, unspecified trimester: Secondary | ICD-10-CM | POA: Diagnosis present

## 2018-01-26 DIAGNOSIS — R45851 Suicidal ideations: Secondary | ICD-10-CM | POA: Diagnosis present

## 2018-01-26 DIAGNOSIS — F332 Major depressive disorder, recurrent severe without psychotic features: Secondary | ICD-10-CM | POA: Diagnosis present

## 2018-01-26 DIAGNOSIS — O99340 Other mental disorders complicating pregnancy, unspecified trimester: Principal | ICD-10-CM | POA: Diagnosis present

## 2018-01-26 DIAGNOSIS — F4310 Post-traumatic stress disorder, unspecified: Secondary | ICD-10-CM | POA: Diagnosis present

## 2018-01-26 LAB — CBC WITH DIFFERENTIAL/PLATELET
Abs Immature Granulocytes: 0.07 10*3/uL (ref 0.00–0.07)
BASOS PCT: 0 %
Basophils Absolute: 0 10*3/uL (ref 0.0–0.1)
EOS ABS: 0.3 10*3/uL (ref 0.0–0.5)
Eosinophils Relative: 2 %
HCT: 32.3 % — ABNORMAL LOW (ref 36.0–46.0)
Hemoglobin: 10.2 g/dL — ABNORMAL LOW (ref 12.0–15.0)
Immature Granulocytes: 1 %
Lymphocytes Relative: 37 %
Lymphs Abs: 5.5 10*3/uL — ABNORMAL HIGH (ref 0.7–4.0)
MCH: 24.8 pg — ABNORMAL LOW (ref 26.0–34.0)
MCHC: 31.6 g/dL (ref 30.0–36.0)
MCV: 78.4 fL — ABNORMAL LOW (ref 80.0–100.0)
Monocytes Absolute: 1.6 10*3/uL — ABNORMAL HIGH (ref 0.1–1.0)
Monocytes Relative: 11 %
Neutro Abs: 7.3 10*3/uL (ref 1.7–7.7)
Neutrophils Relative %: 49 %
PLATELETS: 513 10*3/uL — AB (ref 150–400)
RBC: 4.12 MIL/uL (ref 3.87–5.11)
RDW: 18.8 % — AB (ref 11.5–15.5)
WBC: 14.8 10*3/uL — ABNORMAL HIGH (ref 4.0–10.5)
nRBC: 0 % (ref 0.0–0.2)

## 2018-01-26 MED ORDER — ACETAMINOPHEN 500 MG PO TABS
1000.0000 mg | ORAL_TABLET | Freq: Three times a day (TID) | ORAL | Status: DC | PRN
  Administered 2018-01-26: 1000 mg via ORAL
  Filled 2018-01-26: qty 2

## 2018-01-26 MED ORDER — ONDANSETRON 4 MG PO TBDP
4.0000 mg | ORAL_TABLET | Freq: Three times a day (TID) | ORAL | Status: DC | PRN
  Administered 2018-01-27 – 2018-01-30 (×3): 4 mg via ORAL
  Filled 2018-01-26 (×4): qty 1

## 2018-01-26 MED ORDER — LORAZEPAM 1 MG PO TABS
1.0000 mg | ORAL_TABLET | Freq: Three times a day (TID) | ORAL | Status: DC | PRN
  Administered 2018-01-26 – 2018-01-30 (×4): 1 mg via ORAL
  Filled 2018-01-26 (×4): qty 1

## 2018-01-26 MED ORDER — MAGNESIUM HYDROXIDE 400 MG/5ML PO SUSP: 30.0000 mL | Freq: Every day | ORAL | Status: DC | PRN

## 2018-01-26 MED ORDER — BUPRENORPHINE HCL-NALOXONE HCL 2-0.5 MG SL SUBL
2.0000 | SUBLINGUAL_TABLET | Freq: Three times a day (TID) | SUBLINGUAL | Status: DC
  Administered 2018-01-26 – 2018-01-31 (×14): 2 via SUBLINGUAL
  Filled 2018-01-26 (×5): qty 2
  Filled 2018-01-26: qty 1
  Filled 2018-01-26 (×5): qty 2
  Filled 2018-01-26: qty 1
  Filled 2018-01-26: qty 2
  Filled 2018-01-26: qty 1
  Filled 2018-01-26 (×2): qty 2

## 2018-01-26 MED ORDER — PRENATAL PLUS 27-1 MG PO TABS
1.0000 | ORAL_TABLET | Freq: Every day | ORAL | Status: DC
  Administered 2018-01-28 – 2018-01-31 (×4): 1 via ORAL
  Filled 2018-01-26 (×3): qty 1
  Filled 2018-01-26: qty 7
  Filled 2018-01-26 (×2): qty 1

## 2018-01-26 MED ORDER — ACETAMINOPHEN 500 MG PO TABS
1000.0000 mg | ORAL_TABLET | Freq: Three times a day (TID) | ORAL | Status: DC | PRN
  Administered 2018-01-27: 1000 mg via ORAL
  Filled 2018-01-26: qty 2

## 2018-01-26 MED ORDER — CEPHALEXIN 500 MG PO CAPS
500.0000 mg | ORAL_CAPSULE | Freq: Two times a day (BID) | ORAL | Status: DC
  Administered 2018-01-26 – 2018-01-31 (×10): 500 mg via ORAL
  Filled 2018-01-26 (×3): qty 1
  Filled 2018-01-26: qty 2
  Filled 2018-01-26 (×3): qty 1
  Filled 2018-01-26: qty 3
  Filled 2018-01-26 (×6): qty 1

## 2018-01-26 MED ORDER — ALUM & MAG HYDROXIDE-SIMETH 200-200-20 MG/5ML PO SUSP: 30.0000 mL | ORAL | Status: DC | PRN

## 2018-01-26 MED ORDER — METOCLOPRAMIDE HCL 10 MG PO TABS: 10.0000 mg | ORAL_TABLET | Freq: Three times a day (TID) | ORAL | Status: DC | PRN
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
## 2018-01-26 NOTE — BH Assessment (Signed)
BHH Assessment Progress Note  Per Nira ConnJason Berry, NP, this pt requires psychiatric hospitalization at this time.  Brook, RN, [facility]; [facility] will be ready to receive pt after 09:00.  Pt has signed Voluntary Admission and Consent for Treatment, as well as Consent to Release Information to no one, and signed forms have [facility].  Pt's nurse has been notified, and agrees to send original paperwork along with pt via [facility], and to call report to [phone].  Doylene Canninghomas Leverne Amrhein, [place] Behavioral Health Coordinator [phone]

## 2018-01-26 NOTE — Progress Notes (Signed)
Patient did not attend AA group meeting. 

## 2018-01-26 NOTE — Progress Notes (Signed)
Pt presents with a flat affect and depressed mood. [facility] Pt reported that she's seeking treatment for substance abuse from daily crack cocaine and heroin use. Pt reported using 1 gram of crack cocaine and 2 gr of heroin daily. Pt expressed that she recently found out that she's pregnant and endorsed having SI with a plan to run her car off a bridge at that time. Pt stated that she called the crisis hotline for treatment. Pt expressed that she last received tx 2 years ago for substance abuse and depression.   V/s assessed, belongings searched, skin assessment completed and required documents reviewed with pt and signed. Pt safely brought onto the unit. No withdrawal symptoms verbalized by pt at this time.

## 2018-01-26 NOTE — ED Notes (Signed)
Hold transfer temporarily.  Will call report in one hour.
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
## 2018-01-26 NOTE — ED Notes (Signed)
Pt discharged safely with [place] driver.  Pt was achy but was in no acute distress.  All belongs were sent with patient.

## 2018-01-26 NOTE — Tx Team (Signed)
Initial Treatment Plan 01/26/2018 2:35 PM Geanie KenningShannon B Doughtie BMW:[id number]    PATIENT STRESSORS: Financial difficulties Health problems Substance abuse   PATIENT STRENGTHS: Ability for insight Motivation for treatment/growth   PATIENT IDENTIFIED PROBLEMS: "pregnancy"  "substance abuse (heroin and crack)"                   DISCHARGE CRITERIA:  Ability to meet basic life and health needs Adequate post-discharge living arrangements Improved stabilization in mood, thinking, and/or behavior  PRELIMINARY DISCHARGE PLAN: Attend aftercare/continuing care group Attend PHP/IOP  PATIENT/FAMILY INVOLVEMENT: This treatment plan has been presented to and reviewed with the patient, Geanie KenningShannon B Dewoody, and/or family member.  The patient and family have been given the opportunity to ask questions and make suggestions.  Layla BarterWhite, Kree Armato L, RN 01/26/2018, 2:35 PM

## 2018-01-26 NOTE — BHH Counselor (Signed)
Per Tresa EndoKelly H., PA pt's white blood cell count is 14.8.  Discussed with Illene SilverBrook, AC, RN.  Redmond Pullingreylese D Debi Cousin, MS, [facility], [facility] Triage Specialist [phone]

## 2018-01-26 NOTE — Progress Notes (Signed)
1:1 Note: Pt ringed the call bell to the nurses station for help. Writer went to check on pt and the pt was noted to be extremely agitated. Pt was observed yelling and cursing at Clinical research associatewriter. Pt stated, "I can't take this, I would rather go back to using drugs". Pt was noted to be fidgety while in bed and covering her face with the bed sheet. Pt told writer that she can't take the withdrawal symptom, she's going crazy and that she needs more medications because the suboxone did nothing for her.   Writer notified Dr. Jama Flavorsobos for orders. Per Dr. Jama Flavorsobos, writer may order Ativan 1 mg every 8 hours, prn for agitation (verify with pharmacy). Place pt on 1:1 observation for safety and resp depression. Have night shift NP reassess pt. Encouraged po fluids. Assess v/s every 4 hours.   Writer [facility] Terri[facility] for Ativan 1 mg every 8 hr, prn.   Pt placed on a 1:1, Clinical research associatewriter informed pt Community education officerand sitter. Report given to oncoming nurse and Inetta Fermoina, [facility].   V/s assessed at this time.  Fluids administered to pt at this time.

## 2018-01-26 NOTE — Progress Notes (Signed)
Pt is lying in bed with eyes closed, but is in discomfort and restless.  When writer asked her if she was ok, she just moaned.  Pt continues on 1:1 for safety.  Sitter at bedside.  Vital signs monitored q4h per doctor orders.  Pt safe at this time.

## 2018-01-26 NOTE — BHH Counselor (Signed)
Per [place], [facility], RN pt [facility] and assigned to room/bed: 307-1. Pt can arrive after 0900. Attending physician: Dr. Altamese Carolina[place]ville. Nursing report: [phone]. Updated disposition discussed with Fredrich RomansKelly H., PA and Fannie KneeSue, RN.   Voluntary paperwork needed.  Redmond Pullingreylese D Gelisa Tieken, MS, [facility], [facility] Triage Specialist [phone]

## 2018-01-26 NOTE — ED Notes (Signed)
Pt and belongings wanded and taken to SAPPU 37. Belongings put in locker 37

## 2018-01-26 NOTE — ED Provider Notes (Signed)
4:00 AM Repeat CBC with leukocytosis of 14.8 down from 19.1. Thurston Poundsrey of TTS updated on repeat labs. Anticipate transfer to inpatient facility for ongoing psychiatric care. Patient medically cleared.  4:05 AM [facility].  Tylenol and Reglan have been ordered for management of opioid withdrawal.  The patient is currently pregnant.   Antony MaduraHumes, Estefan Pattison, PA-C 01/26/18 0407    Derwood KaplanNanavati, Ankit, MD 01/27/18 [phone]

## 2018-01-26 NOTE — ED Notes (Signed)
Endorsed once here on acute unit she was SI with a plan to drive her car off a bridge. Acknowledged not a plan she could perform here and she verbalized she was able to contract for safety while here.
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
## 2018-01-27 DIAGNOSIS — F332 Major depressive disorder, recurrent severe without psychotic features: Secondary | ICD-10-CM

## 2018-01-27 DIAGNOSIS — F1123 Opioid dependence with withdrawal: Secondary | ICD-10-CM

## 2018-01-27 LAB — URINE CULTURE
Culture: NO GROWTH
Special Requests: NORMAL

## 2018-01-27 MED ORDER — ACETAMINOPHEN 325 MG PO TABS
650.0000 mg | ORAL_TABLET | ORAL | Status: DC | PRN
  Administered 2018-01-27 – 2018-01-31 (×5): 650 mg via ORAL
  Filled 2018-01-27 (×5): qty 2

## 2018-01-27 MED ORDER — NICOTINE 21 MG/24HR TD PT24
21.0000 mg | MEDICATED_PATCH | Freq: Every day | TRANSDERMAL | Status: DC
  Administered 2018-01-30: 21 mg via TRANSDERMAL
  Filled 2018-01-27 (×5): qty 1

## 2018-01-27 NOTE — Progress Notes (Signed)
Psychoeducational Group Note  Date:  01/27/2018 Time:  2206  Group Topic/Focus:  Wrap-Up Group:   The focus of this group is to help patients review their daily goal of treatment and discuss progress on daily workbooks.  Participation Level: Did Not Attend  Participation Quality:  Not Applicable  Affect:  Not Applicable  Cognitive:  Not Applicable  Insight:  Not Applicable  Engagement in Group: Not Applicable  Additional Comments:  The patient did not attend group this evening since she was asleep in her bedroom.   Vennie Waymire S 01/27/2018, 10:06 PM

## 2018-01-27 NOTE — Progress Notes (Addendum)
[facility] MD Progress Note  01/27/2018 9:36 AM Deborah Boone  MRN:  [id number]   Subjective: Patient reports today that she does not feel much better than she did yesterday and still feels that she is having numerous withdrawal symptoms.  She does however deny any suicidal or homicidal ideations and denies any hallucinations today she reports that she still feels irritable but finds it is due to withdrawal symptoms.  She states that she will not harm herself while she is on the unit and will seek assistance if she starts feeling suicidal or very aggressive.  She reports that she slept okay last night and reports that she has not had much of an appetite thus far.  Objective: Patient's chart and findings reviewed and discussed with treatment team.  Patient presents in her bed and has her head covered up.  She makes minimal eye contact but does answer questions appropriately.  Patient does answer questions sounding irritable and frustrated with me continue to ask questions.  Patient has not made any attempts at self-harm or to harm anyone else and her aggressive behavior has decreased due to the medications that have been started.  Principal Problem: Severe recurrent major depression without psychotic features (HCC) Diagnosis: Principal Problem:   Severe recurrent major depression without psychotic features (HCC) Active Problems:   Opioid use with withdrawal (HCC)  Total Time spent with patient: 20 minutes  Past Psychiatric History: See H&P  Past Medical History:  Past Medical History:  Diagnosis Date  . Anxiety   . Asthma   . Hearing loss in right ear    Pt reports hearing loss in right ear  . Polysubstance abuse (HCC)   . PTSD (post-traumatic stress disorder)   . Small bowel obstruction (HCC)   . Vision abnormalities    Pt wears glasses    Past Surgical History:  Procedure Laterality Date  . COLONOSCOPY WITH PROPOFOL N/A 08/20/2015   Procedure: COLONOSCOPY WITH PROPOFOL;  Surgeon: Graylin Shiver, MD;  Location: [facility];  Service: Endoscopy;  Laterality: N/A;  . FRACTURE SURGERY     Family History: History reviewed. No pertinent family history. Family Psychiatric  History: See H&P Social History:  Social History   Substance and Sexual Activity  Alcohol Use Not Currently  . Frequency: Never     Social History   Substance and Sexual Activity  Drug Use Yes  . Types: Cocaine, Heroin, Hydrocodone, Oxycodone, Marijuana, Benzodiazepines, Other-see comments, Methamphetamines   Comment: heroine, xanax, crack    Social History   Socioeconomic History  . Marital status: Single    Spouse name: Not on file  . Number of children: Not on file  . Years of education: Not on file  . Highest education level: Not on file  Occupational History  . Not on file  Social Needs  . Financial resource strain: Not on file  . Food insecurity:    Worry: Not on file    Inability: Not on file  . Transportation needs:    Medical: Not on file    Non-medical: Not on file  Tobacco Use  . Smoking status: Current Every Day Smoker    Packs/day: 1.00    Years: 5.00    Pack years: 5.00    Types: Cigarettes  . Smokeless tobacco: Never Used  Substance and Sexual Activity  . Alcohol use: Not Currently    Frequency: Never  . Drug use: Yes    Types: Cocaine, Heroin, Hydrocodone, Oxycodone, Marijuana, Benzodiazepines, Other-see comments,  Methamphetamines    Comment: heroine, xanax, crack  . Sexual activity: Yes    Birth control/protection: Condom  Lifestyle  . Physical activity:    Days per week: Not on file    Minutes per session: Not on file  . Stress: Not on file  Relationships  . Social connections:    Talks on phone: Not on file    Gets together: Not on file    Attends religious service: Not on file    Active member of club or organization: Not on file    Attends meetings of clubs or organizations: Not on file    Relationship status: Not on file  Other Topics Concern  . Not  on file  Social History Narrative  . Not on file   Additional Social History:                         Sleep: Good  Appetite:  Fair  Current Medications: Current Facility-Administered Medications  Medication Dose Route Frequency Provider Last Rate Last Dose  . acetaminophen (TYLENOL) tablet 1,000 mg  1,000 mg Oral Q8H PRN Nira Conn A, NP      . alum & mag hydroxide-simeth (MAALOX/MYLANTA) 200-200-20 MG/5ML suspension 30 mL  30 mL Oral Q4H PRN Nira Conn A, NP      . buprenorphine-naloxone (SUBOXONE) 2-0.5 mg per SL tablet 2 tablet  2 tablet Sublingual TID Antonieta Pert, MD   2 tablet at 01/27/18 0909  . cephALEXin (KEFLEX) capsule 500 mg  500 mg Oral Q12H Nira Conn A, NP   500 mg at 01/27/18 0909  . LORazepam (ATIVAN) tablet 1 mg  1 mg Oral Q8H PRN Serenity Fortner, Rockey Situ, MD   1 mg at 01/26/18 1918  . magnesium hydroxide (MILK OF MAGNESIA) suspension 30 mL  30 mL Oral Daily PRN Nira Conn A, NP      . nicotine (NICODERM CQ - dosed in mg/24 hours) patch 21 mg  21 mg Transdermal Daily Nira Conn A, NP      . ondansetron (ZOFRAN-ODT) disintegrating tablet 4 mg  4 mg Oral Q8H PRN Money, Gerlene Burdock, FNP   4 mg at 01/27/18 0909  . prenatal multivitamin tablet 1 tablet  1 tablet Oral Q1200 Andrw Mcguirt, Rockey Situ, MD        Lab Results:  Results for orders placed or performed during the hospital encounter of 01/25/18 (from the past 48 hour(s))  Comprehensive metabolic panel     Status: None   Collection Time: 01/25/18  7:55 PM  Result Value Ref Range   Sodium 137 135 - 145 mmol/L   Potassium 3.5 3.5 - 5.1 mmol/L   Chloride 108 98 - 111 mmol/L   CO2 22 22 - 32 mmol/L   Glucose, Bld 94 70 - 99 mg/dL   BUN 10 6 - 20 mg/dL   Creatinine, Ser 1.61 0.44 - 1.00 mg/dL   Calcium 9.3 8.9 - 09.6 mg/dL   Total Protein 7.0 6.5 - 8.1 g/dL   Albumin 3.7 3.5 - 5.0 g/dL   AST 16 15 - 41 U/L   ALT 11 0 - 44 U/L   Alkaline Phosphatase 55 38 - 126 U/L   Total Bilirubin 0.5 0.3 - 1.2 mg/dL    GFR calc non Af Amer >60 >60 mL/min   GFR calc Af Amer >60 >60 mL/min   Anion gap 7 5 - 15    Comment: Performed at [facility],  [address] [place], [place] [address]  CBC with Differential     Status: Abnormal   Collection Time: 01/25/18  7:55 PM  Result Value Ref Range   WBC 19.1 (H) 4.0 - 10.5 K/uL   RBC 4.46 3.87 - 5.11 MIL/uL   Hemoglobin 10.8 (L) 12.0 - 15.0 g/dL   HCT 91.435.0 (L) 78.236.0 - 95.646.0 %   MCV 78.5 (L) 80.0 - 100.0 fL   MCH 24.2 (L) 26.0 - 34.0 pg   MCHC 30.9 30.0 - 36.0 g/dL   RDW 21.318.9 (H) 08.611.5 - 57.815.5 %   Platelets 567 (H) 150 - 400 K/uL   nRBC 0.0 0.0 - 0.2 %   Neutrophils Relative % 60 %   Neutro Abs 11.4 (H) 1.7 - 7.7 K/uL   Lymphocytes Relative 29 %   Lymphs Abs 5.6 (H) 0.7 - 4.0 K/uL   Monocytes Relative 9 %   Monocytes Absolute 1.7 (H) 0.1 - 1.0 K/uL   Eosinophils Relative 1 %   Eosinophils Absolute 0.2 0.0 - 0.5 K/uL   Basophils Relative 0 %   Basophils Absolute 0.1 0.0 - 0.1 K/uL   Immature Granulocytes 1 %   Abs Immature Granulocytes 0.12 (H) 0.00 - 0.07 K/uL    Comment: Performed at [facility], [address] [place], [place] [address]  Ethanol     Status: None   Collection Time: 01/25/18  7:55 PM  Result Value Ref Range   Alcohol, Ethyl (B) <10 <10 mg/dL    Comment: (NOTE) Lowest detectable limit for serum alcohol is 10 mg/dL. For medical purposes only. Performed at [facility], [address] [place], [place] [address]   Salicylate level     Status: None   Collection Time: 01/25/18  7:55 PM  Result Value Ref Range   Salicylate Lvl <7.0 2.8 - 30.0 mg/dL    Comment: Performed at [facility], [address] [place], [place] [address]  Acetaminophen level     Status: Abnormal   Collection Time: 01/25/18  7:55 PM  Result Value Ref Range   Acetaminophen (Tylenol), Serum <10 (L) 10 - 30 ug/mL    Comment: (NOTE) Therapeutic concentrations vary significantly. A  range of 10-30 ug/mL  may be an effective concentration for many patients. However, some  are best treated at concentrations outside of this range. Acetaminophen concentrations >150 ug/mL at 4 hours after ingestion  and >50 ug/mL at 12 hours after ingestion are often associated with  toxic reactions. Performed at [facility], [address] [place], [place] [address]   hCG, quantitative, pregnancy     Status: Abnormal   Collection Time: 01/25/18  7:55 PM  Result Value Ref Range   hCG, Beta Chain, Quant, S 75,303 (H) <5 mIU/mL    Comment:          GEST. AGE      CONC.  (mIU/mL)   <=1 WEEK        5 - 50     2 WEEKS       50 - 500     3 WEEKS       100 - 10,000     4 WEEKS     1,000 - 30,000     5 WEEKS     3,500 - 115,000   6-8 WEEKS     12,000 - 270,000    12 WEEKS     15,000 - 220,000  FEMALE AND NON-PREGNANT FEMALE:     LESS THAN 5 mIU/mL Performed at [facility], [address], [place], [place] [address]   Urinalysis, Routine w reflex microscopic     Status: Abnormal   Collection Time: 01/25/18  9:50 PM  Result Value Ref Range   Color, Urine YELLOW YELLOW   APPearance HAZY (A) CLEAR   Specific Gravity, Urine 1.024 1.005 - 1.030   pH 5.0 5.0 - 8.0   Glucose, UA NEGATIVE NEGATIVE mg/dL   Hgb urine dipstick SMALL (A) NEGATIVE   Bilirubin Urine NEGATIVE NEGATIVE   Ketones, ur NEGATIVE NEGATIVE mg/dL   Protein, ur NEGATIVE NEGATIVE mg/dL   Nitrite NEGATIVE NEGATIVE   Leukocytes, UA LARGE (A) NEGATIVE   RBC / HPF 21-50 0 - 5 RBC/hpf   WBC, UA >50 (H) 0 - 5 WBC/hpf   Bacteria, UA RARE (A) NONE SEEN   Squamous Epithelial / LPF 6-10 0 - 5   Mucus PRESENT    Hyaline Casts, UA PRESENT    Ca Oxalate Crys, UA PRESENT     Comment: Performed at [facility], [address] [place], [place] [address]  Urine rapid drug screen (hosp performed)     Status: Abnormal   Collection Time: 01/25/18  9:50 PM  Result Value  Ref Range   Opiates POSITIVE (A) NONE DETECTED   Cocaine POSITIVE (A) NONE DETECTED   Benzodiazepines NONE DETECTED NONE DETECTED   Amphetamines POSITIVE (A) NONE DETECTED   Tetrahydrocannabinol POSITIVE (A) NONE DETECTED   Barbiturates NONE DETECTED NONE DETECTED    Comment: (NOTE) DRUG SCREEN FOR MEDICAL PURPOSES ONLY.  IF CONFIRMATION IS NEEDED FOR ANY PURPOSE, NOTIFY LAB WITHIN 5 DAYS. LOWEST DETECTABLE LIMITS FOR URINE DRUG SCREEN Drug Class                     Cutoff (ng/mL) Amphetamine and metabolites    1000 Barbiturate and metabolites    200 Benzodiazepine                 200 Tricyclics and metabolites     300 Opiates and metabolites        300 Cocaine and metabolites        300 THC                            50 Performed at [facility], [address], [place], [place] [address]   CBC with Differential     Status: Abnormal   Collection Time: 01/26/18  3:00 AM  Result Value Ref Range   WBC 14.8 (H) 4.0 - 10.5 K/uL   RBC 4.12 3.87 - 5.11 MIL/uL   Hemoglobin 10.2 (L) 12.0 - 15.0 g/dL   HCT 91.4 (L) 78.2 - 95.6 %   MCV 78.4 (L) 80.0 - 100.0 fL   MCH 24.8 (L) 26.0 - 34.0 pg   MCHC 31.6 30.0 - 36.0 g/dL   RDW 21.3 (H) 08.6 - 57.8 %   Platelets 513 (H) 150 - 400 K/uL   nRBC 0.0 0.0 - 0.2 %   Neutrophils Relative % 49 %   Neutro Abs 7.3 1.7 - 7.7 K/uL   Lymphocytes Relative 37 %   Lymphs Abs 5.5 (H) 0.7 - 4.0 K/uL   Monocytes Relative 11 %   Monocytes Absolute 1.6 (H) 0.1 - 1.0 K/uL   Eosinophils Relative 2 %   Eosinophils Absolute 0.3 0.0 -  0.5 K/uL   Basophils Relative 0 %   Basophils Absolute 0.0 0.0 - 0.1 K/uL   Immature Granulocytes 1 %   Abs Immature Granulocytes 0.07 0.00 - 0.07 K/uL    Comment: Performed at [facility], [address], [place], [place] [address]    Blood Alcohol level:  Lab Results  Component Value Date   ETH <10 01/25/2018   ETH <10 01/25/2018    Metabolic Disorder Labs: No results found  for: HGBA1C, MPG No results found for: PROLACTIN No results found for: CHOL, TRIG, HDL, CHOLHDL, VLDL, LDLCALC  Physical Findings: AIMS: Facial and Oral Movements Muscles of Facial Expression: None, normal Lips and Perioral Area: None, normal Jaw: None, normal Tongue: None, normal,Extremity Movements Upper (arms, wrists, hands, fingers): None, normal Lower (legs, knees, ankles, toes): None, normal, Trunk Movements Neck, shoulders, hips: None, normal, Overall Severity Severity of abnormal movements (highest score from questions above): None, normal Incapacitation due to abnormal movements: None, normal Patient's awareness of abnormal movements (rate only patient's report): No Awareness, Dental Status Current problems with teeth and/or dentures?: No Does patient usually wear dentures?: No  CIWA:  CIWA-Ar Total: 10 COWS:     Musculoskeletal: Strength & Muscle Tone: within normal limits Gait & Station: normal Patient leans: N/A  Psychiatric Specialty Exam: Physical Exam  Nursing note and vitals reviewed. Constitutional: She is oriented to person, place, and time. She appears well-developed and well-nourished.  Cardiovascular: Normal rate.  Respiratory: Effort normal.  Musculoskeletal: Normal range of motion.  Neurological: She is alert and oriented to person, place, and time.  Skin: Skin is warm.    Review of Systems  Constitutional: Negative.   HENT: Negative.   Eyes: Negative.   Respiratory: Negative.   Cardiovascular: Negative.   Gastrointestinal: Negative.   Genitourinary: Negative.   Musculoskeletal: Negative.   Skin: Negative.   Neurological: Negative.   Endo/Heme/Allergies: Negative.   Psychiatric/Behavioral: Positive for depression and substance abuse. Negative for hallucinations and suicidal ideas.       Irritable    Blood pressure 107/63, pulse 86, temperature 98.4 F (36.9 C), temperature source Oral, resp. rate 20, height 5\' 1"  (1.549 m), weight 52.2 kg,  SpO2 100 %.Body mass index is 21.73 kg/m.  General Appearance: Disheveled  Eye Contact:  Minimal  Speech:  Clear and Coherent  Volume:  Normal  Mood:  Irritable  Affect:  Flat  Thought Process:  Coherent and Descriptions of Associations: Intact  Orientation:  Full (Time, Place, and Person)  Thought Content:  WDL  Suicidal Thoughts:  No  Homicidal Thoughts:  No  Memory:  Immediate;   Good Recent;   Good Remote;   Good  Judgement:  Impaired  Insight:  Lacking  Psychomotor Activity:  Normal  Concentration:  Concentration: Good and Attention Span: Good  Recall:  Good  Fund of Knowledge:  Good  Language:  Good  Akathisia:  No  Handed:  Right  AIMS (if indicated):     Assets:  Communication Skills Desire for Improvement Physical Health Resilience Social Support Transportation  ADL's:  Intact  Cognition:  WNL  Sleep:  Number of Hours: 6.5   Problems addressed MDD severe recurrent Opiate use with withdrawal  Treatment Plan Summary: Daily contact with patient to assess and evaluate symptoms and progress in treatment, Medication management and Plan is to: Continue Suboxone 2-0.5 mg 2 tablets sublingual 3 times daily for withdrawal symptoms and opiate abuse Continue Ativan 1 mg p.o. every 8 hours as needed  for agitation Discontinue 1:1 sitter Encourage group therapy participation  Maryfrances Bunnell, FNP   .Marland KitchenAgree with NP Progress Note  01/27/2018, 9:36 AM

## 2018-01-27 NOTE — Progress Notes (Signed)
Pt still in bed with eyes closed.  Pt states she does not feel well d/t her withdrawal symptoms.  Her vital signs have been stable throughout the night.  Continue 1:1 for safety.  Sitter at bedside.  Pt safe at this time.

## 2018-01-27 NOTE — Progress Notes (Signed)
Post 1:1 Note   Pt resting quietly in bed- resp even and unlabored. Pt remains safe on the unit

## 2018-01-27 NOTE — [facility] Group Notes (Signed)
[facility] Group Notes:  (Nursing)  Date:  01/27/2018  Time: 215 PM Type of Therapy:  Nurse Education  Participation Level:  Did Not Attend  Shela NevinValerie S Yaniah Thiemann 01/27/2018, 5:21 PM

## 2018-01-27 NOTE — Progress Notes (Signed)
Post 1:1 Notes  Pt sleeping in bed -resp even and unlabored. Pt remains safe with Q 15 min checks

## 2018-01-27 NOTE — Progress Notes (Signed)
Pt lying in bed with eyes closed, but moving restlessly in bed.  Pt made no complaints or requests while writer in the room.  Continue 1:1 for safety.  Sitter at bedside.  Pt remains safe at this time.

## 2018-01-27 NOTE — Progress Notes (Signed)
D. Pt presents with a flat affect / irritable mood- remained in bed for the majority of the shift due to moderate withdrawal symptoms (aches, nausea, chills). Pt currently denies SI/HI and AV hallucinations  A. Labs and vitals monitored. Pt compliant with medications. Pt supported emotionally and encouraged to express concerns and ask questions.   R. Pt remains safe with 15 minute checks. Will continue POC.

## 2018-01-27 NOTE — Progress Notes (Signed)
Post 1:1 Notes  Pt resting in bed- respiratory even and unlabored. Pt remain safe on the unit with 15 min checks

## 2018-01-27 NOTE — Progress Notes (Signed)
1:1 Discontinued  Pt reports feeling a little better today- still reporting moderate withdrawal symptoms- Pt denies SI/HI and A/V hallucinations. Pt encouraged to drink fluids. Pt resting in bed at this time Pt remains safe on the unit with Q 15 min checks.

## 2018-01-27 NOTE — Progress Notes (Signed)
Post 1:1 Notes  Pt remains in bed resting. Brought pt another pillow and blanket per request . Fluids provided . Pt remains safe with 15 min checks

## 2018-01-27 NOTE — [facility] Group Notes (Signed)
LCSW Group Therapy Note  01/27/2018    10:00-11:00am   Type of Therapy and Topic:  Group Therapy: Early Messages Received About Anger  Participation Level:  Did Not Attend   Description of Group:   In this group, patients shared and discussed the early messages received in their lives about anger through parental or other adult modeling, teaching, repression, punishment, violence, and more.  Participants identified how those childhood lessons influence even now how they usually or often react when angered.  The group discussed that anger is a secondary emotion and what may be the underlying emotional themes that come out through anger outbursts or that are ignored through anger suppression.  Finally, as a group there was a conversation about the workbook's quote that "There is nothing wrong with anger; it is just a sign something needs to change."     Therapeutic Goals: 1. Patients will identify one or more childhood message about anger that they received and how it was taught to them. 2. Patients will discuss how these childhood experiences have influenced and continue to influence their own expression or repression of anger even today. 3. Patients will explore possible primary emotions that tend to fuel their secondary emotion of anger. 4. Patients will learn that anger itself is normal and cannot be eliminated, and that healthier coping skills can assist with resolving conflict rather than worsening situations.  Summary of Patient Progress:    Therapeutic Modalities:   Cognitive Behavioral Therapy Motivation Interviewing  Deborah Boone  .

## 2018-01-27 NOTE — [facility] Group Notes (Signed)
[facility] Group Notes:  (Nursing)  Date:  01/27/2018  Time:215 PM Type of Therapy:  Nurse Education  Participation Level:  Did Not Attend  Shela NevinValerie S Khrista Braun 01/27/2018, 6:28 PM

## 2018-01-28 LAB — HCG, QUANTITATIVE, PREGNANCY: hCG, Beta Chain, Quant, S: 111357 m[IU]/mL — ABNORMAL HIGH (ref <5)

## 2018-01-28 MED ORDER — MIRTAZAPINE 7.5 MG PO TABS
7.5000 mg | ORAL_TABLET | Freq: Every day | ORAL | Status: DC
  Administered 2018-01-28: 7.5 mg via ORAL
  Filled 2018-01-28 (×2): qty 1

## 2018-01-28 MED ORDER — ENSURE ENLIVE PO LIQD
237.0000 mL | Freq: Two times a day (BID) | ORAL | Status: DC
  Administered 2018-01-28 – 2018-01-31 (×8): 237 mL via ORAL

## 2018-01-28 MED ORDER — DIPHENHYDRAMINE HCL 25 MG PO CAPS
25.0000 mg | ORAL_CAPSULE | Freq: Four times a day (QID) | ORAL | Status: DC | PRN
  Administered 2018-01-28 – 2018-01-31 (×4): 25 mg via ORAL
  Filled 2018-01-28 (×4): qty 1
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
## 2018-01-28 NOTE — Progress Notes (Signed)
D. Pt presents with a sad affect/irritable mood. Pt remains very depressed- remaining in bed all morning-pt continues to complain of mild withdrawal symptoms (achey- no appetite)  Pt currently denies SI/HI and AVH and agrees to contact staff before acting on any harmful thoughts.  A. Labs and vitals monitored. Pt compliant with medications. Pt supported emotionally and encouraged to express concerns and ask questions.   R. Pt remains safe with 15 minute checks. Will continue POC.

## 2018-01-28 NOTE — Plan of Care (Addendum)
D: Patient is in bed on approach. Patient went to dayroom for snack and went back to bed. Patient is alert and cooperative. Patient presents with flat and sad facial expression. Patient presents with sad, liable, irritable affect. Patient denies SI, HI, AVH, and verbally contracts for safety. Patient reports whole body aches. Patient mood is labile. Patient became a little tearful once she would allow this RN to assess her. Patient requests PRN pain medication. PRN medication for agitation/anxiety also administered.    A: Scheduled medications administered per MD order. PRN pain and agitiation medication administered per MD order. Support provided. Patient educated on safety on the unit and medications. Routine safety checks every 15 minutes. Patient stated understanding to tell nurse about any new physical symptoms. Patient understands to tell staff of any needs.     R: No adverse drug reactions noted. Patient verbally contracts for safety. Patient remains safe at this time and will continue to monitor.     Problem: Safety: Goal: Periods of time without injury will increase Outcome: Progressing   Patient remains safe and will continue to monitor.

## 2018-01-28 NOTE — Progress Notes (Signed)
Patient did not attend the evening speaker AA meeting. Pt was notified that group was beginning but remained in bed.   
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
## 2018-01-28 NOTE — [facility] Group Notes (Signed)
[facility] LCSW Group Therapy Note  01/28/2018  10:00-11:00AM  Type of Therapy and Topic:  Group Therapy:  Adding Supports Including Being Your Own Support  Participation Level:  Did Not Attend   Description of Group:  Patients in this group were introduced to the concept that additional supports including self-support are an essential part of recovery.  A song entitled "I Need Help!" was played and a group discussion was held in reaction to the idea of needing to add supports.  A song entitled "My Own Hero" was played and a group discussion ensued in which patients stated they could relate to the song and it inspired them to realize they have be willing to help themselves in order to succeed, because other people cannot achieve sobriety or stability for them.  We discussed adding a variety of healthy supports to address the various needs in their lives.  A song was played called "I Know Where I've Been" toward the end of group and used to conduct an inspirational wrap-up to group of remembering how far they have already come in their journey.  Therapeutic Goals: 1)  demonstrate the importance of being a part of one's own support system 2)  discuss reasons people in one's life may eventually be unable to be continually supportive  3)  identify the patient's current support system and   4)  elicit commitments to add healthy supports and to become more conscious of being self-supportive   Summary of Patient Progress:  The patient did not attend.    Therapeutic Modalities:   Motivational Interviewing Activity  Meeyah Ovitt J Grossman-Orr       

## 2018-01-28 NOTE — Progress Notes (Addendum)
[facility] MD Progress Note  01/28/2018 10:48 AM PAETON LATOUCHE  MRN:  [id number]   Subjective: Patient states that today she is still feeling very very depressed.  She denies any suicidal or homicidal ideations and denies any hallucinations.  Patient states that she has been using drugs since she was approximately 22 years old and is only had a 19-month sobriety since the age of 72.  Patient reports that her depression is usually moderate managed well with medications but she cannot remember any medications that she has been on in the past.  She reports one time of having auditory hallucinations but was high on illicit substances when that happened.  Patient reports still having severe agitation.  She does report that she slept well last night.  Patient does report and having no appetite and has not been eating well since she has been here.  Objective: Patient's chart and findings reviewed and discussed with treatment team.  Patient presents in her bed lying but is awake.  She is pleasant, calm, and cooperative during interview but does answer questions sending irritated after multiple questions.  Discussed medications with Dr. Jama Flavors and with pharmacist Elana and have decided to use diphenhydramine for agitation and will use Remeron very low dose of 7.5 mg to help increase appetite and to assist with her depression.  Principal Problem: Severe recurrent major depression without psychotic features (HCC) Diagnosis: Principal Problem:   Severe recurrent major depression without psychotic features (HCC) Active Problems:   Opioid use with withdrawal (HCC)  Total Time spent with patient: 30 minutes  Past Psychiatric History: See H&P  Past Medical History:  Past Medical History:  Diagnosis Date  . Anxiety   . Asthma   . Hearing loss in right ear    Pt reports hearing loss in right ear  . Polysubstance abuse (HCC)   . PTSD (post-traumatic stress disorder)   . Small bowel obstruction (HCC)   . Vision  abnormalities    Pt wears glasses    Past Surgical History:  Procedure Laterality Date  . COLONOSCOPY WITH PROPOFOL N/A 08/20/2015   Procedure: COLONOSCOPY WITH PROPOFOL;  Surgeon: Graylin Shiver, MD;  Location: [facility];  Service: Endoscopy;  Laterality: N/A;  . FRACTURE SURGERY     Family History: History reviewed. No pertinent family history. Family Psychiatric  History: See H&P Social History:  Social History   Substance and Sexual Activity  Alcohol Use Not Currently  . Frequency: Never     Social History   Substance and Sexual Activity  Drug Use Yes  . Types: Cocaine, Heroin, Hydrocodone, Oxycodone, Marijuana, Benzodiazepines, Other-see comments, Methamphetamines   Comment: heroine, xanax, crack    Social History   Socioeconomic History  . Marital status: Single    Spouse name: Not on file  . Number of children: Not on file  . Years of education: Not on file  . Highest education level: Not on file  Occupational History  . Not on file  Social Needs  . Financial resource strain: Not on file  . Food insecurity:    Worry: Not on file    Inability: Not on file  . Transportation needs:    Medical: Not on file    Non-medical: Not on file  Tobacco Use  . Smoking status: Current Every Day Smoker    Packs/day: 1.00    Years: 5.00    Pack years: 5.00    Types: Cigarettes  . Smokeless tobacco: Never Used  Substance and Sexual  Activity  . Alcohol use: Not Currently    Frequency: Never  . Drug use: Yes    Types: Cocaine, Heroin, Hydrocodone, Oxycodone, Marijuana, Benzodiazepines, Other-see comments, Methamphetamines    Comment: heroine, xanax, crack  . Sexual activity: Yes    Birth control/protection: Condom  Lifestyle  . Physical activity:    Days per week: Not on file    Minutes per session: Not on file  . Stress: Not on file  Relationships  . Social connections:    Talks on phone: Not on file    Gets together: Not on file    Attends religious service:  Not on file    Active member of club or organization: Not on file    Attends meetings of clubs or organizations: Not on file    Relationship status: Not on file  Other Topics Concern  . Not on file  Social History Narrative  . Not on file   Additional Social History:                         Sleep: Good  Appetite:  Poor  Current Medications: Current Facility-Administered Medications  Medication Dose Route Frequency Provider Last Rate Last Dose  . acetaminophen (TYLENOL) tablet 650 mg  650 mg Oral Q4H PRN Nira ConnBerry, Jason A, NP   650 mg at 01/28/18 0825  . alum & mag hydroxide-simeth (MAALOX/MYLANTA) 200-200-20 MG/5ML suspension 30 mL  30 mL Oral Q4H PRN Nira ConnBerry, Jason A, NP      . buprenorphine-naloxone (SUBOXONE) 2-0.5 mg per SL tablet 2 tablet  2 tablet Sublingual TID Antonieta Pertlary, Greg Lawson, MD   2 tablet at 01/28/18 [id number]  . cephALEXin (KEFLEX) capsule 500 mg  500 mg Oral Q12H Nira ConnBerry, Jason A, NP   500 mg at 01/28/18 0826  . diphenhydrAMINE (BENADRYL) capsule 25 mg  25 mg Oral Q6H PRN Money, Gerlene Burdockravis B, FNP      . LORazepam (ATIVAN) tablet 1 mg  1 mg Oral Q8H PRN Pansey Pinheiro, Rockey SituFernando A, MD   1 mg at 01/27/18 2137  . magnesium hydroxide (MILK OF MAGNESIA) suspension 30 mL  30 mL Oral Daily PRN Nira ConnBerry, Jason A, NP      . mirtazapine (REMERON) tablet 7.5 mg  7.5 mg Oral QHS Money, Travis B, FNP      . nicotine (NICODERM CQ - dosed in mg/24 hours) patch 21 mg  21 mg Transdermal Daily Nira ConnBerry, Jason A, NP      . ondansetron (ZOFRAN-ODT) disintegrating tablet 4 mg  4 mg Oral Q8H PRN Money, Gerlene Burdockravis B, FNP   4 mg at 01/27/18 0909  . prenatal multivitamin tablet 1 tablet  1 tablet Oral Q1200 Tucker Minter, Rockey SituFernando A, MD        Lab Results:  No results found for this or any previous visit (from the past 48 hour(s)).  Blood Alcohol level:  Lab Results  Component Value Date   ETH <10 01/25/2018   ETH <10 01/25/2018    Metabolic Disorder Labs: No results found for: HGBA1C, MPG No results found for:  PROLACTIN No results found for: CHOL, TRIG, HDL, CHOLHDL, VLDL, LDLCALC  Physical Findings: AIMS: Facial and Oral Movements Muscles of Facial Expression: None, normal Lips and Perioral Area: None, normal Jaw: None, normal Tongue: None, normal,Extremity Movements Upper (arms, wrists, hands, fingers): None, normal Lower (legs, knees, ankles, toes): None, normal, Trunk Movements Neck, shoulders, hips: None, normal, Overall Severity Severity of abnormal movements (highest score from questions above):  None, normal Incapacitation due to abnormal movements: None, normal Patient's awareness of abnormal movements (rate only patient's report): No Awareness, Dental Status Current problems with teeth and/or dentures?: No Does patient usually wear dentures?: No  CIWA:  CIWA-Ar Total: 10 COWS:     Musculoskeletal: Strength & Muscle Tone: within normal limits Gait & Station: normal Patient leans: N/A  Psychiatric Specialty Exam: Physical Exam  Nursing note and vitals reviewed. Constitutional: She is oriented to person, place, and time. She appears well-developed and well-nourished.  Cardiovascular: Normal rate.  Respiratory: Effort normal.  Musculoskeletal: Normal range of motion.  Neurological: She is alert and oriented to person, place, and time.  Skin: Skin is warm.    Review of Systems  Constitutional: Negative.   HENT: Negative.   Eyes: Negative.   Respiratory: Negative.   Cardiovascular: Negative.   Gastrointestinal: Negative.   Genitourinary: Negative.   Musculoskeletal: Negative.   Skin: Negative.   Neurological: Negative.   Endo/Heme/Allergies: Negative.   Psychiatric/Behavioral: Positive for depression and substance abuse. Negative for hallucinations and suicidal ideas.       Irritable    Blood pressure 119/83, pulse (!) 110, temperature 98.3 F (36.8 C), temperature source Oral, resp. rate 20, height 5\' 1"  (1.549 m), weight 52.2 kg, SpO2 100 %.Body mass index is 21.73  kg/m.  General Appearance: Disheveled  Eye Contact:  Minimal  Speech:  Clear and Coherent  Volume:  Normal  Mood:  Depressed and Irritable  Affect:  Flat  Thought Process:  Coherent and Descriptions of Associations: Intact  Orientation:  Full (Time, Place, and Person)  Thought Content:  WDL  Suicidal Thoughts:  No  Homicidal Thoughts:  No  Memory:  Immediate;   Good Recent;   Good Remote;   Good  Judgement:  Impaired  Insight:  Lacking  Psychomotor Activity:  Normal  Concentration:  Concentration: Good and Attention Span: Good  Recall:  Good  Fund of Knowledge:  Good  Language:  Good  Akathisia:  No  Handed:  Right  AIMS (if indicated):     Assets:  Communication Skills Desire for Improvement Physical Health Resilience Social Support Transportation  ADL's:  Intact  Cognition:  WNL  Sleep:  Number of Hours: 6.25   Problems addressed MDD severe recurrent Opiate use with withdrawal  Treatment Plan Summary: Daily contact with patient to assess and evaluate symptoms and progress in treatment, Medication management and Plan is to: Continue Suboxone 2-0.5 mg 2 tablets sublingual 3 times daily for withdrawal symptoms and opiate abuse Start Remeron 7.5 mg p.o. nightly to assist with  appetite and depression Start diphenhydramine 25 mg p.o. every 6 hours as needed for agitation and anxiety Continue Ativan 1 mg p.o. every 8 hours as needed for agitation Encourage group therapy participation  Maryfrances Bunnell, FNP 01/28/2018, 10:48 AM   ..Agree with NP Progress Note

## 2018-01-28 NOTE — [facility] Group Notes (Signed)
[facility] Group Notes:  (Nursing)  Date:  01/28/2018  Time: 130 PM Type of Therapy:  Nurse Education  Participation Level:  Did Not Attend  Shela NevinValerie S Estellar Cadena 01/28/2018, 2:53 PM

## 2018-01-28 NOTE — Progress Notes (Signed)
D:  Deborah Boone was in her room much of the evening.  She did not attend evening AA group.  She denied SI/HI or A/V hallucinations.  She reported her day was "horrible" due to "hurting all over and not having energy."  She stated she would like to be put on an antidepressant that will increase her energy.  She did get up for snack and hs medications.  She is currently resting with her eyes closed and appears to be asleep. A:  1:1 with RN for support and encouragement.  Medications as ordered.  Q 15 minute checks maintained for safety.  Encouraged participation in group and unit activities.   R:  Deborah Boone remains safe on the unit.  We will continue to monitor the progress towards her goals.

## 2018-01-29 DIAGNOSIS — F1193 Opioid use, unspecified with withdrawal: Secondary | ICD-10-CM

## 2018-01-29 DIAGNOSIS — F4310 Post-traumatic stress disorder, unspecified: Secondary | ICD-10-CM

## 2018-01-29 MED ORDER — SERTRALINE HCL 50 MG PO TABS
50.0000 mg | ORAL_TABLET | Freq: Every day | ORAL | Status: DC
  Administered 2018-01-29 – 2018-01-30 (×2): 50 mg via ORAL
  Filled 2018-01-29 (×3): qty 1

## 2018-01-29 NOTE — Progress Notes (Signed)
Deborah HerterShannon was complaining of abdominal pain, 7/10.  Pain is in lower abdomen and aches.  She denied that it was cramping in nature.  She was unsure when her last BM was but was noted lying in her bed moving her legs around.  Blood pressure low and Gatorade provided and encouraged her to drink.  Staffed with Barbara CowerJason NP, continue to monitor and encourage fluids.  At 730am, she was noted on the phone talking with family.  Repeat vital signs obtained and were WNL.  She immediately went back to her room and was noted to be asleep.  Attempted to get her to come up AM medications but she has not come up to med window as of now.  We will continue to monitor.

## 2018-01-29 NOTE — [facility] Group Notes (Signed)
LCSW Group Therapy Note   01/29/2018 1:15pm   Type of Therapy and Topic:  Group Therapy:  Overcoming Obstacles   Participation Level:  DID NOT ATTEND. Pt invited. Chose to remain in bed.    Description of Group:    In this group patients will be encouraged to explore what they see as obstacles to their own wellness and recovery. They will be guided to discuss their thoughts, feelings, and behaviors related to these obstacles. The group will process together ways to cope with barriers, with attention given to specific choices patients can make. Each patient will be challenged to identify changes they are motivated to make in order to overcome their obstacles. This group will be process-oriented, with patients participating in exploration of their own experiences as well as giving and receiving support and challenge from other group members.   Therapeutic Goals: 1. Patient will identify personal and current obstacles as they relate to admission. 2. Patient will identify barriers that currently interfere with their wellness or overcoming obstacles.  3. Patient will identify feelings, thought process and behaviors related to these barriers. 4. Patient will identify two changes they are willing to make to overcome these obstacles:      Summary of Patient Progress   x   Therapeutic Modalities:   Cognitive Behavioral Therapy Solution Focused Therapy Motivational Interviewing Relapse Prevention Therapy  Rona RavensHeather S Astha Probasco, LCSW 01/29/2018 11:44 AM

## 2018-01-29 NOTE — [facility] Group Notes (Signed)
[facility] Group Notes:  (Nursing/MHT/Case Management/Adjunct)  Date:  01/29/2018  Time: 1600 Type of Therapy:  Nurse Education  Participation Level:  Did Not Attend  Summary of Progress/Problems: Pt did not attend groups despite multiple encouragement and prompts "I'm tired, I'm sleepy".  Sherryl MangesWesseh, Emmalyn Hinson 01/29/2018, 1700

## 2018-01-29 NOTE — Tx Team (Signed)
Interdisciplinary Treatment and Diagnostic Plan Update  01/29/2018 0830AM LASHALA LASER MRN: [id number]  Principal Diagnosis: Severe recurrent major depression without psychotic features [facility])  Secondary Diagnoses: Principal Problem:   Severe recurrent major depression without psychotic features (HCC) Active Problems:   Opioid use with withdrawal (HCC)   Current Medications:  Current Facility-Administered Medications  Medication Dose Route Frequency Provider Last Rate Last Dose  . acetaminophen (TYLENOL) tablet 650 mg  650 mg Oral Q4H PRN Nira Conn A, NP   650 mg at 01/29/18 0410  . alum & mag hydroxide-simeth (MAALOX/MYLANTA) 200-200-20 MG/5ML suspension 30 mL  30 mL Oral Q4H PRN Nira Conn A, NP      . buprenorphine-naloxone (SUBOXONE) 2-0.5 mg per SL tablet 2 tablet  2 tablet Sublingual TID Antonieta Pert, MD   2 tablet at 01/29/18 [phone]  . cephALEXin (KEFLEX) capsule 500 mg  500 mg Oral Q12H Nira Conn A, NP   500 mg at 01/29/18 0952  . diphenhydrAMINE (BENADRYL) capsule 25 mg  25 mg Oral Q6H PRN Money, Gerlene Burdock, FNP   25 mg at 01/28/18 2113  . feeding supplement (ENSURE ENLIVE) (ENSURE ENLIVE) liquid 237 mL  237 mL Oral BID BM Money, Gerlene Burdock, FNP   237 mL at 01/29/18 0953  . LORazepam (ATIVAN) tablet 1 mg  1 mg Oral Q8H PRN Cobos, Rockey Situ, MD   1 mg at 01/28/18 1744  . magnesium hydroxide (MILK OF MAGNESIA) suspension 30 mL  30 mL Oral Daily PRN Nira Conn A, NP      . nicotine (NICODERM CQ - dosed in mg/24 hours) patch 21 mg  21 mg Transdermal Daily Nira Conn A, NP      . ondansetron (ZOFRAN-ODT) disintegrating tablet 4 mg  4 mg Oral Q8H PRN Money, Gerlene Burdock, FNP   4 mg at 01/27/18 0909  . prenatal multivitamin tablet 1 tablet  1 tablet Oral Q1200 Cobos, Rockey Situ, MD   1 tablet at 01/28/18 1732  . sertraline (ZOLOFT) tablet 50 mg  50 mg Oral Daily Micheal Likens, MD       PTA Medications: Medications Prior to Admission  Medication Sig Dispense  Refill Last Dose  . albuterol (PROVENTIL HFA;VENTOLIN HFA) 108 (90 Base) MCG/ACT inhaler Inhale 2 puffs into the lungs every 4 (four) hours as needed for wheezing or shortness of breath. (Patient not taking: Reported on 01/25/2018)   Not Taking at Unknown time  . gabapentin (NEURONTIN) 300 MG capsule Take 300 mg by mouth 3 (three) times daily.   Past Month at Unknown time  . meloxicam (MOBIC) 15 MG tablet Take 15 mg by mouth 2 (two) times daily.   Past Month at Unknown time  . Prenatal Vit-Fe Fumarate-FA (PRENATAL COMPLETE) 14-0.4 MG TABS Take 1 tablet by mouth daily. 60 each 0     Patient Stressors: Financial difficulties Health problems Substance abuse  Patient Strengths: Ability for insight Motivation for treatment/growth  Treatment Modalities: Medication Management, Group therapy, Case management,  1 to 1 session with clinician, Psychoeducation, Recreational therapy.   Physician Treatment Plan for Primary Diagnosis: Severe recurrent major depression without psychotic features (HCC) Long Term Goal(s): Improvement in symptoms so as ready for discharge Improvement in symptoms so as ready for discharge   Short Term Goals: Ability to identify changes in lifestyle to reduce recurrence of condition will improve Ability to verbalize feelings will improve Ability to disclose and discuss suicidal ideas Ability to demonstrate self-control will improve Ability to identify and  develop effective coping behaviors will improve Ability to maintain clinical measurements within normal limits will improve Compliance with prescribed medications will improve Ability to identify changes in lifestyle to reduce recurrence of condition will improve Ability to maintain clinical measurements within normal limits will improve  Medication Management: Evaluate patient's response, side effects, and tolerance of medication regimen.  Therapeutic Interventions: 1 to 1 sessions, Unit Group sessions and Medication  administration.  Evaluation of Outcomes: Progressing  Physician Treatment Plan for Secondary Diagnosis: Principal Problem:   Severe recurrent major depression without psychotic features (HCC) Active Problems:   Opioid use with withdrawal (HCC)  Long Term Goal(s): Improvement in symptoms so as ready for discharge Improvement in symptoms so as ready for discharge   Short Term Goals: Ability to identify changes in lifestyle to reduce recurrence of condition will improve Ability to verbalize feelings will improve Ability to disclose and discuss suicidal ideas Ability to demonstrate self-control will improve Ability to identify and develop effective coping behaviors will improve Ability to maintain clinical measurements within normal limits will improve Compliance with prescribed medications will improve Ability to identify changes in lifestyle to reduce recurrence of condition will improve Ability to maintain clinical measurements within normal limits will improve     Medication Management: Evaluate patient's response, side effects, and tolerance of medication regimen.  Therapeutic Interventions: 1 to 1 sessions, Unit Group sessions and Medication administration.  Evaluation of Outcomes: Progressing   RN Treatment Plan for Primary Diagnosis: Severe recurrent major depression without psychotic features (HCC) Long Term Goal(s): Knowledge of disease and therapeutic regimen to maintain health will improve  Short Term Goals: Ability to remain free from injury will improve, Ability to disclose and discuss suicidal ideas and Ability to identify and develop effective coping behaviors will improve  Medication Management: RN will administer medications as ordered by provider, will assess and evaluate patient's response and provide education to patient for prescribed medication. RN will report any adverse and/or side effects to prescribing provider.  Therapeutic Interventions: 1 on 1 counseling  sessions, Psychoeducation, Medication administration, Evaluate responses to treatment, Monitor vital signs and CBGs as ordered, Perform/monitor CIWA, COWS, AIMS and Fall Risk screenings as ordered, Perform wound care treatments as ordered.  Evaluation of Outcomes: Progressing   LCSW Treatment Plan for Primary Diagnosis: Severe recurrent major depression without psychotic features (HCC) Long Term Goal(s): Safe transition to appropriate next level of care at discharge, Engage patient in therapeutic group addressing interpersonal concerns.  Short Term Goals: Engage patient in aftercare planning with referrals and resources, Facilitate patient progression through stages of change regarding substance use diagnoses and concerns and Identify triggers associated with mental health/substance abuse issues  Therapeutic Interventions: Assess for all discharge needs, 1 to 1 time with Social worker, Explore available resources and support systems, Assess for adequacy in community support network, Educate family and significant other(s) on suicide prevention, Complete Psychosocial Assessment, Interpersonal group therapy.  Evaluation of Outcomes: Progressing   Progress in Treatment: Attending groups: No. Participating in groups: No. Taking medication as prescribed: Yes. Toleration medication: Yes. Family/Significant other contact made: Yes, individual(s) contacted:  pt's mother/father for collateral information and to complete SPE.  Patient understands diagnosis: Yes. Discussing patient identified problems/goals with staff: Yes. Medical problems stabilized or resolved: Yes. Denies suicidal/homicidal ideation: Yes. Issues/concerns per patient self-inventory: No. Other: n/a   New problem(s) identified: Pt just found out that she is pregnant. Pt is homeless with no identified resources. CSW assessing.   New Short Term/Long Term Goal(s): detox,  medication management for mood stabilization; elimination of  SI thoughts; development of comprehensive mental wellness/sobriety plan.   Patient Goals:  "I want to get sober and find housing for me now that I'm pregnant."   Discharge Plan or Barriers: CSW assessing--[facility], [facility], and [facility] information provided to pt. CSW encouraged pt to begin contacting these facilities to complete phone screening and obtain additional information regarding possible payment. Pt irritable and minimally invested in contacting these facilities currently. CSW continuing to assess. [facility] pamphlet, Mobile Crisis information, and AA/NA information provided to patient for additional community support and resources.   Reason for Continuation of Hospitalization: Anxiety Depression Medication stabilization Suicidal ideation Withdrawal symptoms Other; describe agitation  Estimated Length of Stay: Friday, 02/02/18  Attendees: Patient: 01/29/2018 10:58 AM  Physician: Dr. Altamese Carolinaain[place] MD 01/29/2018 10:58 AM  Nursing: Lincoln Maxinlivette RN; Herbert SetaHeather RN 01/29/2018 10:58 AM  RN Care Manager:x 01/29/2018 10:58 AM  Social Worker: Corrie MckusickHeather Xareni Kelch LCSW 01/29/2018 10:58 AM  Recreational Therapist: x 01/29/2018 10:58 AM  Other: Armandina StammerAgnes Nwoko NP 01/29/2018 10:58 AM  Other:  01/29/2018 10:58 AM  Other: 01/29/2018 10:58 AM    Scribe for Treatment Team: Rona RavensHeather S Brittian Renaldo, LCSW 01/29/2018 10:58 AM

## 2018-01-29 NOTE — Progress Notes (Signed)
Did not attend group 

## 2018-01-29 NOTE — Plan of Care (Signed)
Problem: Medication: Goal: Compliance with prescribed medication regimen will improve Outcome: Progressing   Problem: Safety: Goal: Periods of time without injury will increase Outcome: Progressing DAR Note: Pt has been isolative to room majority of this shift. OOB for medications only. Flat and irritable on approach but is verbally redirectable. Denies SI, HI, AVH and pain when assessed. Reports low energy, good concentration level and good sleep with poor appetite related to nausea. Compliant with medications when offered. Denies side effects. Pt did not attend groups or to her ADLs despite multiple prompts "I'm tired, I'm sleepy" when approach. Scheduled and PRN medications (Zofran--c/o nausea) given per provider's orders with verbal education  and effects monitored. Encouraged pt to comply with treatment regimen including groups, voice concerns and attend to ADLs. Safety checks maintained without outburst or self harm gestures thus far. Pt tolerates all PO intake ( mostly fluids--d/t poor appetite) well.  Pt remains safe on unit.
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
## 2018-01-29 NOTE — Progress Notes (Signed)
Recreation Therapy Notes  Date: 12.2.19 Time: 0930 Location: [address] Dayroom  Group Topic: Stress Management  Goal Area(s) Addresses:  Patient will verbalize importance of using healthy stress management.  Patient will identify positive emotions associated with healthy stress management.   Intervention: Stress Management  Activity :  Meditation.  LRT introduced the stress management technique of meditation.  LRT played Boone meditation dealing with impermanence.  Patients were to listen and follow along as the meditation played to engage in the technique.  Education:  Stress Management, Discharge Planning.   Education Outcome: Acknowledges edcuation/In group clarification offered/Needs additional education  Clinical Observations/Feedback: Pt did not attend group.    Deborah Boone, LRT/CTRS         Deborah Boone 01/29/2018 11:18 AM 

## 2018-01-30 DIAGNOSIS — R45851 Suicidal ideations: Secondary | ICD-10-CM

## 2018-01-30 DIAGNOSIS — F4310 Post-traumatic stress disorder, unspecified: Secondary | ICD-10-CM

## 2018-01-30 MED ORDER — NICOTINE POLACRILEX 2 MG MT GUM
2.0000 mg | CHEWING_GUM | OROMUCOSAL | Status: DC | PRN
  Administered 2018-01-30: 2 mg via ORAL

## 2018-01-30 MED ORDER — NICOTINE POLACRILEX 2 MG MT GUM
CHEWING_GUM | OROMUCOSAL | Status: AC
  Filled 2018-01-30: qty 1

## 2018-01-30 MED ORDER — FLUOXETINE HCL 20 MG PO CAPS
20.0000 mg | ORAL_CAPSULE | Freq: Every day | ORAL | Status: DC
  Administered 2018-01-31: 20 mg via ORAL
  Filled 2018-01-30 (×2): qty 1
  Filled 2018-01-30: qty 7
  Filled 2018-01-30: qty 1

## 2018-01-30 NOTE — Progress Notes (Signed)
Did not attend group 

## 2018-01-30 NOTE — Plan of Care (Signed)
D: Patient in bed on approach and remained there the whole night. Medications had to be taken to patients room for her to take them. Patient denies SI, HI, AVH, and verbally contracts for safety.    A: Scheduled medications administered per MD order. Support provided. Patient educated on safety on the unit and medications. Routine safety checks every 15 minutes. Patient stated understanding to tell nurse about any new physical symptoms. Patient understands to tell staff of any needs.     R: No adverse drug reactions noted. Patient verbally contracts for safety. Patient remains safe at this time and will continue to monitor.   Problem: Safety: Goal: Periods of time without injury will increase Outcome: Progressing   Patient remains safe and will continue to monitor.

## 2018-01-30 NOTE — Progress Notes (Signed)
[facility] MD Progress Note  01/30/2018 8:28 AM [name]  MRN:  [id number] Subjective:   Deborah Boone states she is having no withdrawal symptoms from heroin she states she still has no place to live and she is fearful of discharge we will talk to the team and case worker she does not have thoughts of harming himself can contract here no acute psychosis.  Depression present but is interested in different antidepressant at this point Principal Problem: Severe recurrent major depression without psychotic features (HCC) Diagnosis: Principal Problem:   Severe recurrent major depression without psychotic features (HCC) Active Problems:   Opioid use with withdrawal (HCC)   PTSD (post-traumatic stress disorder)  Total Time spent with patient: 20 minutes  Past Medical History:  Past Medical History:  Diagnosis Date  . Anxiety   . Asthma   . Hearing loss in right ear    Pt reports hearing loss in right ear  . Polysubstance abuse (HCC)   . PTSD (post-traumatic stress disorder)   . Small bowel obstruction (HCC)   . Vision abnormalities    Pt wears glasses    Past Surgical History:  Procedure Laterality Date  . COLONOSCOPY WITH PROPOFOL N/A 08/20/2015   Procedure: COLONOSCOPY WITH PROPOFOL;  Surgeon: Graylin Shiver, MD;  Location: [facility];  Service: Endoscopy;  Laterality: N/A;  . FRACTURE SURGERY     Family History: History reviewed. No pertinent family history.  Social History:  Social History   Substance and Sexual Activity  Alcohol Use Not Currently  . Frequency: Never     Social History   Substance and Sexual Activity  Drug Use Yes  . Types: Cocaine, Heroin, Hydrocodone, Oxycodone, Marijuana, Benzodiazepines, Other-see comments, Methamphetamines   Comment: heroine, xanax, crack    Social History   Socioeconomic History  . Marital status: Single    Spouse name: Not on file  . Number of children: Not on file  . Years of education: Not on file  . Highest education level:  Not on file  Occupational History  . Not on file  Social Needs  . Financial resource strain: Not on file  . Food insecurity:    Worry: Not on file    Inability: Not on file  . Transportation needs:    Medical: Not on file    Non-medical: Not on file  Tobacco Use  . Smoking status: Current Every Day Smoker    Packs/day: 1.00    Years: 5.00    Pack years: 5.00    Types: Cigarettes  . Smokeless tobacco: Never Used  Substance and Sexual Activity  . Alcohol use: Not Currently    Frequency: Never  . Drug use: Yes    Types: Cocaine, Heroin, Hydrocodone, Oxycodone, Marijuana, Benzodiazepines, Other-see comments, Methamphetamines    Comment: heroine, xanax, crack  . Sexual activity: Yes    Birth control/protection: Condom  Lifestyle  . Physical activity:    Days per week: Not on file    Minutes per session: Not on file  . Stress: Not on file  Relationships  . Social connections:    Talks on phone: Not on file    Gets together: Not on file    Attends religious service: Not on file    Active member of club or organization: Not on file    Attends meetings of clubs or organizations: Not on file    Relationship status: Not on file  Other Topics Concern  . Not on file  Social History Narrative  .  Not on file   Additional Social History:                         Sleep: Fair  Appetite:  Fair  Current Medications: Current Facility-Administered Medications  Medication Dose Route Frequency Provider Last Rate Last Dose  . acetaminophen (TYLENOL) tablet 650 mg  650 mg Oral Q4H PRN Nira ConnBerry, Jason A, NP   650 mg at 01/29/18 0410  . alum & mag hydroxide-simeth (MAALOX/MYLANTA) 200-200-20 MG/5ML suspension 30 mL  30 mL Oral Q4H PRN Nira ConnBerry, Jason A, NP      . buprenorphine-naloxone (SUBOXONE) 2-0.5 mg per SL tablet 2 tablet  2 tablet Sublingual TID Antonieta Pertlary, Greg Lawson, MD   2 tablet at 01/30/18 [phone]  . cephALEXin (KEFLEX) capsule 500 mg  500 mg Oral Q12H Nira ConnBerry, Jason A, NP   500 mg  at 01/30/18 0807  . diphenhydrAMINE (BENADRYL) capsule 25 mg  25 mg Oral Q6H PRN Money, Gerlene Burdockravis B, FNP   25 mg at 01/29/18 2146  . feeding supplement (ENSURE ENLIVE) (ENSURE ENLIVE) liquid 237 mL  237 mL Oral BID BM Money, Feliz Beamravis B, FNP   237 mL at 01/29/18 1440  . FLUoxetine (PROZAC) capsule 20 mg  20 mg Oral Daily Malvin JohnsFarah, Keela Rubert, MD      . magnesium hydroxide (MILK OF MAGNESIA) suspension 30 mL  30 mL Oral Daily PRN Nira ConnBerry, Jason A, NP      . nicotine (NICODERM CQ - dosed in mg/24 hours) patch 21 mg  21 mg Transdermal Daily Nira ConnBerry, Jason A, NP   21 mg at 01/30/18 0808  . ondansetron (ZOFRAN-ODT) disintegrating tablet 4 mg  4 mg Oral Q8H PRN Money, Gerlene Burdockravis B, FNP   4 mg at 01/29/18 1738  . prenatal multivitamin tablet 1 tablet  1 tablet Oral Q1200 Cobos, Rockey SituFernando A, MD   1 tablet at 01/29/18 1300    Lab Results:  Results for orders placed or performed during the hospital encounter of 01/26/18 (from the past 48 hour(s))  hCG, quantitative, pregnancy     Status: Abnormal   Collection Time: 01/28/18  6:42 PM  Result Value Ref Range   hCG, Beta Chain, Quant, S 111,357 (H) <5 mIU/mL    Comment:          GEST. AGE      CONC.  (mIU/mL)   <=1 WEEK        5 - 50     2 WEEKS       50 - 500     3 WEEKS       100 - 10,000     4 WEEKS     1,000 - 30,000     5 WEEKS     3,500 - 115,000   6-8 WEEKS     12,000 - 270,000    12 WEEKS     15,000 - 220,000        FEMALE AND NON-PREGNANT FEMALE:     LESS THAN 5 mIU/mL Performed at [facility], [address] [place], [place] [address]     Blood Alcohol level:  Lab Results  Component Value Date   ETH <10 01/25/2018   ETH <10 01/25/2018    Metabolic Disorder Labs: No results found for: HGBA1C, MPG No results found for: PROLACTIN No results found for: CHOL, TRIG, HDL, CHOLHDL, VLDL, LDLCALC  Physical Findings: AIMS: Facial and Oral Movements Muscles of Facial Expression: None, normal Lips and Perioral  Area: None, normal Jaw:  None, normal Tongue: None, normal,Extremity Movements Upper (arms, wrists, hands, fingers): None, normal Lower (legs, knees, ankles, toes): None, normal, Trunk Movements Neck, shoulders, hips: None, normal, Overall Severity Severity of abnormal movements (highest score from questions above): None, normal Incapacitation due to abnormal movements: None, normal Patient's awareness of abnormal movements (rate only patient's report): No Awareness, Dental Status Current problems with teeth and/or dentures?: No Does patient usually wear dentures?: No  CIWA:  CIWA-Ar Total: 10 COWS:     Musculoskeletal: Strength & Muscle Tone: within normal limits Gait & Station: normal Patient leans: N/A  Psychiatric Specialty Exam: Physical Exam  ROS  Blood pressure 114/86, pulse 76, temperature 98.2 F (36.8 C), temperature source Oral, resp. rate 16, height 5\' 1"  (1.549 m), weight 52.2 kg, SpO2 100 %.Body mass index is 21.73 kg/m.  General Appearance: Casual  Eye Contact:  Good  Speech:  Clear and Coherent  Volume:  Normal  Mood:  Anxious and Depressed  Affect:  Congruent  Thought Process:  Coherent  Orientation:  Full (Time, Place, and Person)  Thought Content:  Logical  Suicidal Thoughts:  No  Homicidal Thoughts:  No  Memory:  Recent;   Good  Judgement:  Good  Insight:  Good  Psychomotor Activity:  Normal  Concentration:  Concentration: Good  Recall:  Good  Fund of Knowledge:  Good  Language:  Good  Akathisia:  Negative  Handed:  Right  AIMS (if indicated):     Assets:  Communication Skills  ADL's:  Intact  Cognition:  WNL  Sleep:  Number of Hours: 5.75    Adjust antidepressant Treatment Plan Summary: Daily contact with patient to assess and evaluate symptoms and progress in treatment and Medication management  Teresea Donley, MD 01/30/2018, 8:28 AM

## 2018-01-30 NOTE — Progress Notes (Signed)
Recreation Therapy Notes  Animal-Assisted Activity (AAA) Program Checklist/Progress Notes Patient Eligibility Criteria Checklist & Daily Group note for Rec Tx Intervention  Date: 12.3.19 Time: 1430 [address]   AAA/T Program Assumption of Risk Form signed by Engineer, productionatient/ or Parent Legal Guardian  YES   Patient is free of allergies or sever asthma  YES   Patient reports no fear of animals  YES   Patient reports no history of cruelty to animals YES   Patient understands his/her participation is voluntary YES   Patient washes hands before animal contact  YES   Patient washes hands after animal contact  YES   Education: Charity fundraiserHand Washing, Appropriate Animal Interaction   Education Outcome: Acknowledges understanding/In group clarification offered/Needs additional education.   Clinical Observations/Feedback: Pt did not attend activity.     Caroll RancherMarjette Climmie Buelow, LRT/CTRS         Caroll RancherLindsay, Teshawn Moan A 01/30/2018 3:51 PM

## 2018-01-30 NOTE — B[facility] Group Notes (Signed)
[facility] 01/30/2018 1:15pm  Type of Therapy: Mental Health Association Presentation  Participation Level: Invited. Chose to remain in bed.   Rona RavensHeather S [name], LCSW 01/30/2018 2:59 PM

## 2018-01-30 NOTE — Plan of Care (Signed)
  Problem: Coping: Goal: Coping ability will improve Outcome: Not Progressing   Problem: Health Behavior/Discharge Planning: Goal: Identification of resources available to assist in meeting health care needs will improve Outcome: Not Progressing   D: Patient compliant with her medications.  She is receiving suboxone 2 tablets in a.m. Her goal today is to "get out of bed."  She is sleeping fair; her appetite is good.  Her energy level is "normal."  She reports withdrawal symptoms as cravings, agitation, cramping, and irritability.  She rates her depression and anxiety as a 5; hopelessness as a 4.  She denies any thoughts of self harm. Per SW, patient has been asked to call several places to stay upon discharge.  She has not complied so far.  Patient is homeless and has no where to go upon discharge.   A: Continue to monitor medication management and MD orders.  Safety checks completed every 15 minutes per protocol.  Offer support and encouragement as needed.  R: Patient is receptive to staff; her behavior is appropriate.

## 2018-01-30 NOTE — Progress Notes (Signed)
CSW met with pt individually this morning to check in. Pt and CSW contact [facility] (no answer) and left a voicemail with [facility] regarding referral for housing. Pt was also encouraged to contact her parents to find out if she could stay with them until accepted into one of these programs. Pt is hopeful that her parents will allow her to do so. Pt has follow-up at [place] for OB/high risk pregnancy referral and would like her medication management to take place with this provider as well. Pt is in good spirits today, and apologized for her behavior with CSW yesterday. Pt reports that she is excited about her pregnancy, but nervous that she will miscarry. 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 CSW faxed [facility] referral and requested that pt continue calling [facility] throughout the day today.   Pariss Hommes S. Ouida Sills, MSW, LCSW Clinical Social Worker 01/30/2018 10:06 AM

## 2018-01-31 MED ORDER — PRENATAL COMPLETE 14-0.4 MG PO TABS: 1.0000 | ORAL_TABLET | Freq: Every day | ORAL | 0 refills | Status: AC

## 2018-01-31 MED ORDER — ALBUTEROL SULFATE HFA 108 (90 BASE) MCG/ACT IN AERS: 2.0000 | INHALATION_SPRAY | RESPIRATORY_TRACT | Status: DC | PRN

## 2018-01-31 MED ORDER — CEPHALEXIN 500 MG PO CAPS: 500.0000 mg | ORAL_CAPSULE | Freq: Two times a day (BID) | ORAL | Status: DC

## 2018-01-31 MED ORDER — MELOXICAM 15 MG PO TABS: 15.0000 mg | ORAL_TABLET | Freq: Two times a day (BID) | ORAL | Status: DC

## 2018-01-31 MED ORDER — FLUOXETINE HCL 20 MG PO CAPS: 20.0000 mg | ORAL_CAPSULE | Freq: Every day | ORAL | 1 refills | Status: DC

## 2018-01-31 MED ORDER — FLUOXETINE HCL 20 MG PO CAPS: 20.0000 mg | ORAL_CAPSULE | Freq: Every day | ORAL | 0 refills | Status: DC

## 2018-01-31 MED ORDER — NICOTINE POLACRILEX 2 MG MT GUM: 2.0000 mg | CHEWING_GUM | OROMUCOSAL | 0 refills | Status: DC | PRN

## 2018-01-31 MED ORDER — BUPRENORPHINE HCL-NALOXONE HCL 2-0.5 MG SL SUBL: 2.0000 | SUBLINGUAL_TABLET | Freq: Three times a day (TID) | SUBLINGUAL | 0 refills | Status: DC

## 2018-01-31 NOTE — Progress Notes (Signed)
CSW met with pt individually this morning. Pt requesting to discharge. Pt pleasant and calm. She did not complete [facility] application as requested. Pt states that she plans to follow up with [facility] and [facility] when she is closer to [redacted] weeks pregnant. She plans to live with her parents at discharge but rescinded CSW ability to contact parents. Pt reports that she and her mother had a bad conversation this morning but states that her father visited her last night and is supportive. Pt has follow-up at the [facility] --referred for high risk pregnancy assessment. Pt has been provided with a 7 day supply of suboxone to last her until this appt. Pt declined additional referrals and wants her OB to prescribe all medications. Pt was encouraged to walk into Maternity admissions if she felt that she wanted to be seen sooner (pt was reporting minor cramping). Lindell Spar NP assessed pt and recommended that she walk in to maternity admissions if cramping persisted. Pt reports that her friend will pick her up after lunch today.   Hazle Ogburn S. Ouida Sills, MSW, LCSW Clinical Social Worker 01/31/2018 10:04 AM

## 2018-01-31 NOTE — Progress Notes (Signed)
Recreation Therapy Notes  Date: 12.4.19 Time: 0930 Location: [address] Day[address]  Group Topic: Stress Management  Goal Area(s) Addresses:  Patient will verbalize importance of using healthy stress management.  Patient will identify positive emotions associated with healthy stress management.   Intervention: Stress Management  Activity :  Guided Imagery.  LRT introduced the stress management technique of guided imagery.  LRT read a script that allowed patients to envision their peaceful place.  Patients were to follow along as script was read to engage in activity.  Education:  Stress Management, Discharge Planning.   Education Outcome: Acknowledges edcuation/In group clarification offered/Needs additional education  Clinical Observations/Feedback: Pt did not attend group.     Caroll RancherMarjette Georgio Hattabaugh, LRT/CTRS         Lillia AbedLindsay, Bradan Congrove A 01/31/2018 11:20 AM

## 2018-01-31 NOTE — Therapy (Signed)
Occupational Therapy Group Note  Date:  01/31/2018 Time:  11:27 AM  Group Topic/Focus:  Self Esteem Action Plan:   The focus of this group is to help patients create a plan to continue to build self-esteem after discharge.  Participation Level:  Active  Participation Quality:  Appropriate  Affect:  Blunted and Depressed  Cognitive:  Appropriate  Insight: Lacking  Engagement in Group:  Engaged  Modes of Intervention:  Activity, Discussion, Education and Socialization  Additional Comments:    S: "I cannot think of positive words"  O: Education given on self esteem and its relationship with mental health. Pt to brainstorm positive vs negative self esteem in group discussion in relation to personal experiences. Pt then to complete A-Z self esteem activity, to list a positive quality about themselves for each letter of the alphabet. Pt encouraged to share words at end of session.  A: Pt presents with blunted/depressed affect, late to group. She shares that she was having abdominal pain (pt is pregnant- RN is aware) and is now feeling better. Pt contributed to discussion minimally, sharing that she is having anxious feelings about her pregnancy and that her self esteem is low. A-Z activity administered, pt shares she is unable to think of positive words, so she is just writing "negative ones". Total A provided going through the alphabet, pt still not consistently committed to thinking of positive words.  P: OT given handouts to help facilitate carryover into community. OT groups will continue to be x1 per week while pt inpatient.  Dalphine HandingKaylee Pike Scantlebury, MSOT, OTR/L Behavioral Health OT/ Acute Relief OT PHP Office: [phone]  Dalphine HandingKaylee Oddie Kuhlmann 01/31/2018, 11:27 AM

## 2018-01-31 NOTE — BHH Suicide Risk Assessment (Signed)
[facility]   Principal Problem: Severe recurrent major depression without psychotic features [facility](HCC) Discharge Diagnoses: Principal Problem:   Severe recurrent major depression without psychotic features (HCC) Active Problems:   Opioid use with withdrawal (HCC)   PTSD (post-traumatic stress disorder)   Total Time spent with patient: 45 minutes  Musculoskeletal: Strength & Muscle Tone: within normal limits Gait & Station: normal Patient leans: N/A  Psychiatric Specialty Exam: ROS  Blood pressure 134/72, pulse 71, temperature 98.5 F (36.9 C), temperature source Oral, resp. rate 16, height 5\' 1"  (1.549 m), weight 52.2 kg, SpO2 100 %.Body mass index is 21.73 kg/m.  General Appearance: kempt  Eye Contact::  Good  Speech:  Clear and Coherent409  Volume:  Normal  Mood:  Euthymic  Affect:  Congruent  Thought Process:  Coherent  Orientation:  Full (Time, Place, and Person)  Thought Content:  Tangential  Suicidal Thoughts:  No  Homicidal Thoughts:  No  Memory:  Immediate;   Good  Judgement:  Good  Insight:  Good  Psychomotor Activity:  Normal  Concentration:  Good  Recall:  Good  Fund of Knowledge:Good  Language: Good  Akathisia:  Negative  Handed:  Right  AIMS (if indicated):     Assets:  Communication Skills  Sleep:  Number of Hours: 6  Cognition: WNL  ADL's:  Intact   Mental Status Per Nursing Assessment::   On Admission:  Suicidal ideation indicated by patient, Suicide plan  Demographic Factors:  Low socioeconomic status  Loss Factors: Decrease in vocational status  Historical Factors: NA  Risk Reduction Factors:   NA  Continued Clinical Symptoms:  Depression:   Severe  Cognitive Features That Contribute To Risk:  n/a  Suicide Risk:  Minimal: No identifiable suicidal ideation.  Patients presenting with no risk factors but with morbid ruminations; may be classified as minimal risk based on the severity of the depressive  symptoms  Follow-up Information    [facility] Go on [facility].   Why:  Please attend your intake OB appointment at 10:00am. Contact information: [address] [place] [id number] [phone]          Plan Of Care/Follow-up recommendations:  Activity:  full  Shogo Larkey, MD 01/31/2018, 9:15 AM
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
## 2018-01-31 NOTE — Progress Notes (Signed)
  [facility] :  Will you be returning to the same living situation after discharge:  Yes,  home with father At discharge, do you have transportation home?: Yes,  friend Do you have the ability to pay for your medications: Yes,  mental health  Release of information consent forms completed and submitted to medical records by CSW.   Patient to Follow up at: Follow-up Information    [facility].   Why:  Please attend your intake OB appointment at 10:00am. If you have cramping or would like to be checked out for any issues sooner than this appt, you may walk-in to maternity admissions (open 24 hours) to be seen. Thank you.  Contact information: [address] [place] [id number] [phone]          Next level of care provider has [facility]:no  Safety Planning and Suicide Prevention discussed: Yes,  SPE completed with pt's father. SPI pamphlet and mobile crisis information provided to pt.   Have you used any form of tobacco in the last 30 days? (Cigarettes, Smokeless Tobacco, Cigars, and/or Pipes): Yes  Has patient been referred to the Quitline?: Patient refused referral  Patient has been referred for addiction treatment: Yes  Rona RavensHeather S Gabriele Loveland, LCSW 01/31/2018, 10:05 AM

## 2018-01-31 NOTE — Progress Notes (Signed)
Nursing discharge note: Patient discharged home per MD order.  Patient received all personal belongings from unit and locker.  Reviewed AVS/transition record with patient and she indicated understanding.  Patient will follow up with her outpatient provider.  Patient denies any thoughts of self harm. She left ambulatory with another patient. Patient had originally said that her father was picking her up.  When she was questioned, she stated, "well fuck it; I'd have to go to a hotel anyway."  Patient was encouraged to go to her ob-gyn appointment.
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
## 2018-01-31 NOTE — Plan of Care (Signed)
  Problem: Education: Goal: Ability to make informed decisions regarding treatment will improve Outcome: Completed/Met   Problem: Coping: Goal: Coping ability will improve Outcome: Completed/Met   Problem: Health Behavior/Discharge Planning: Goal: Identification of resources available to assist in meeting health care needs will improve Outcome: Completed/Met   Problem: Medication: Goal: Compliance with prescribed medication regimen will improve Outcome: Completed/Met   Problem: Self-Concept: Goal: Ability to disclose and discuss suicidal ideas will improve Outcome: Completed/Met Goal: Will verbalize positive feelings about self Outcome: Completed/Met   Problem: Education: Goal: Knowledge of [facility] Education information/materials will improve Outcome: Completed/Met Goal: Emotional status will improve Outcome: Completed/Met Goal: Mental status will improve Outcome: Completed/Met Goal: Verbalization of understanding the information provided will improve Outcome: Completed/Met   Problem: Activity: Goal: Interest or engagement in activities will improve Outcome: Completed/Met Goal: Sleeping patterns will improve Outcome: Completed/Met   Problem: Coping: Goal: Ability to verbalize frustrations and anger appropriately will improve Outcome: Completed/Met Goal: Ability to demonstrate self-control will improve Outcome: Completed/Met   Problem: Health Behavior/Discharge Planning: Goal: Identification of resources available to assist in meeting health care needs will improve Outcome: Completed/Met Goal: Compliance with treatment plan for underlying cause of condition will improve Outcome: Completed/Met   Problem: Physical Regulation: Goal: Ability to maintain clinical measurements within normal limits will improve Outcome: Completed/Met   Problem: Safety: Goal: Periods of time without injury will increase Outcome: Completed/Met   Problem: Education: Goal:  Knowledge of disease or condition will improve Outcome: Completed/Met Goal: Understanding of discharge needs will improve Outcome: Completed/Met   Problem: Health Behavior/Discharge Planning: Goal: Ability to identify changes in lifestyle to reduce recurrence of condition will improve Outcome: Completed/Met Goal: Identification of resources available to assist in meeting health care needs will improve Outcome: Completed/Met   Problem: Physical Regulation: Goal: Complications related to the disease process, condition or treatment will be avoided or minimized Outcome: Completed/Met   Problem: Safety: Goal: Ability to remain free from injury will improve Outcome: Completed/Met   Problem: Education: Goal: Utilization of techniques to improve thought processes will improve Outcome: Completed/Met Goal: Knowledge of the prescribed therapeutic regimen will improve Outcome: Completed/Met   Problem: Activity: Goal: Interest or engagement in leisure activities will improve Outcome: Completed/Met Goal: Imbalance in normal sleep/wake cycle will improve Outcome: Completed/Met   Problem: Coping: Goal: Coping ability will improve Outcome: Completed/Met Goal: Will verbalize feelings Outcome: Completed/Met   Problem: Health Behavior/Discharge Planning: Goal: Ability to make decisions will improve Outcome: Completed/Met Goal: Compliance with therapeutic regimen will improve Outcome: Completed/Met   Problem: Role Relationship: Goal: Will demonstrate positive changes in social behaviors and relationships Outcome: Completed/Met   Problem: Safety: Goal: Ability to disclose and discuss suicidal ideas will improve Outcome: Completed/Met Goal: Ability to identify and utilize support systems that promote safety will improve Outcome: Completed/Met   Problem: Self-Concept: Goal: Will verbalize positive feelings about self Outcome: Completed/Met Goal: Level of anxiety will  decrease Outcome: Completed/Met   Problem: Education: Goal: Ability to state activities that reduce stress will improve Outcome: Completed/Met   Problem: Coping: Goal: Ability to identify and develop effective coping behavior will improve Outcome: Completed/Met   Problem: Self-Concept: Goal: Ability to identify factors that promote anxiety will improve Outcome: Completed/Met Goal: Level of anxiety will decrease Outcome: Completed/Met Goal: Ability to modify response to factors that promote anxiety will improve Outcome: Completed/Met

## 2018-01-31 NOTE — Progress Notes (Signed)
D   Pt spent the shift in bed and no complaints   She has been isolating and did not socialize or attend groups A    Verbal support given    Medications administered and effectiveness monitored    Q 15 min checks R   Pt remains safe at this time

## 2018-01-31 NOTE — Progress Notes (Signed)
[facility]--no availability until January 2019. Pt may call for intake on Thursday with "Steward DroneBrenda." Information provided to pt and she was encouraged to call to get placed on waitlist.  Ethel RanaHeather S. Alan RipperHolloway, MSW, LCSW Clinical Social Worker 01/31/2018 11:34 AM

## 2018-02-06 ENCOUNTER — Other Ambulatory Visit: Payer: Self-pay

## 2018-02-06 ENCOUNTER — Ambulatory Visit: Payer: Self-pay

## 2018-02-15 ENCOUNTER — Encounter (HOSPITAL_COMMUNITY): Payer: Self-pay

## 2018-02-15 ENCOUNTER — Emergency Department (HOSPITAL_COMMUNITY)
Admission: EM | Admit: 2018-02-15 | Discharge: 2018-02-15 | Disposition: A | Discharge status: Home / Self Care | Payer: Medicaid Other | Attending: Emergency Medicine | Admitting: Emergency Medicine

## 2018-02-15 ENCOUNTER — Other Ambulatory Visit: Payer: Self-pay

## 2018-02-15 DIAGNOSIS — O9989 Other specified diseases and conditions complicating pregnancy, childbirth and the puerperium: Secondary | ICD-10-CM | POA: Insufficient documentation

## 2018-02-15 DIAGNOSIS — Z79899 Other long term (current) drug therapy: Secondary | ICD-10-CM | POA: Insufficient documentation

## 2018-02-15 DIAGNOSIS — O99331 Smoking (tobacco) complicating pregnancy, first trimester: Secondary | ICD-10-CM | POA: Diagnosis not present

## 2018-02-15 DIAGNOSIS — Z3A10 10 weeks gestation of pregnancy: Secondary | ICD-10-CM | POA: Insufficient documentation

## 2018-02-15 DIAGNOSIS — J45909 Unspecified asthma, uncomplicated: Secondary | ICD-10-CM | POA: Insufficient documentation

## 2018-02-15 DIAGNOSIS — Z3491 Encounter for supervision of normal pregnancy, unspecified, first trimester: Secondary | ICD-10-CM

## 2018-02-15 DIAGNOSIS — F17210 Nicotine dependence, cigarettes, uncomplicated: Secondary | ICD-10-CM | POA: Insufficient documentation

## 2018-02-15 NOTE — Discharge Instructions (Addendum)
Please call the obstetrician for follow-up  Take a prenatal vitamin every day  Do not smoke cigarettes  Do not use drugs or alcohol

## 2018-02-15 NOTE — ED Triage Notes (Addendum)
Pt states that she believes she is about 4 months pregnant. Pt states that she has been having some aches in her left hip. Pt wants to check on the status of her baby. Pt states she doesn't have insurance and has not been seen by an OBGYN Of note, boyfriend just checked in for STD check.

## 2018-02-15 NOTE — ED Provider Notes (Signed)
[facility]-EMERGENCY DEPT Provider Note   CSN: [id number] Arrival date & time: 02/15/18  1241     History   Chief Complaint Chief Complaint  Patient presents with  . Routine Prenatal Visit    HPI Deborah Boone is a 22 y.o. female.  HPI Patient is a 22 year old female who presents the emergency department requesting obstetrical check.  No vaginal bleeding.  No abdominal pain.  She has not followed up with her obstetrician as she was recommended after recent ER visit.  She is here with a partner who is being checked in for other complaints.  Patient denies vaginal discharge.   Past Medical History:  Diagnosis Date  . Anxiety   . Asthma   . Hearing loss in right ear    Pt reports hearing loss in right ear  . Polysubstance abuse (HCC)   . PTSD (post-traumatic stress disorder)   . Small bowel obstruction (HCC)   . Vision abnormalities    Pt wears glasses    Patient Active Problem List   Diagnosis Date Noted  . Severe recurrent major depression without psychotic features (HCC) 01/26/2018  . Polysubstance dependence including opioid type drug without complication, episodic abuse (HCC) 09/01/2015  . MDD (major depressive disorder) 08/31/2015  . Ileus (HCC) 08/15/2015  . Partial small bowel obstruction 07/20/2015  . Anxiety   . Asthma   . Hearing loss in right ear   . Enteritis 07/18/2015  . PTSD (post-traumatic stress disorder) 03/14/2014  . Opioid use with withdrawal (HCC) 03/12/2014  . Polysubstance (including opioids) dependence with physiological dependence (HCC) 03/12/2014    Past Surgical History:  Procedure Laterality Date  . COLONOSCOPY WITH PROPOFOL N/A 08/20/2015   Procedure: COLONOSCOPY WITH PROPOFOL;  Surgeon: Graylin ShiverSalem F Ganem, MD;  Location: [facility];  Service: Endoscopy;  Laterality: N/A;  . FRACTURE SURGERY       OB History    Gravida  1   Para      Term      Preterm      AB      Living        SAB      TAB     Ectopic      Multiple      Live Births               Home Medications    Prior to Admission medications   Medication Sig Start Date End Date Taking? Authorizing Provider  buprenorphine-naloxone (SUBOXONE) 2-0.5 mg SUBL SL tablet Place 2 tablets under the tongue 3 (three) times daily. (See the hand written prescription): For opioid dependence during pregnancy. Patient not taking: Reported on 02/15/2018 01/31/18   Armandina StammerNwoko, Agnes I, NP  cephALEXin (KEFLEX) 500 MG capsule Take 1 capsule (500 mg total) by mouth every 12 (twelve) hours. For infection Patient not taking: Reported on 02/15/2018 01/31/18   Armandina StammerNwoko, Agnes I, NP  FLUoxetine (PROZAC) 20 MG capsule Take 1 capsule (20 mg total) by mouth daily. For depression Patient not taking: Reported on 02/15/2018 02/01/18   Armandina StammerNwoko, Agnes I, NP  nicotine polacrilex (NICORETTE) 2 MG gum Take 1 each (2 mg total) by mouth as needed for smoking cessation. (may buy from over the counter): For smoking cessation Patient not taking: Reported on 02/15/2018 01/31/18   Armandina StammerNwoko, Agnes I, NP  Prenatal Vit-Fe Fumarate-FA (PRENATAL COMPLETE) 14-0.4 MG TABS Take 1 tablet by mouth daily. (May buy from over the counter or use your home supply): Vitamin supplementation Patient  not taking: Reported on 02/15/2018 01/31/18   Armandina StammerNwoko, Agnes I, NP    Family History No family history on file.  Social History Social History   Tobacco Use  . Smoking status: Current Every Day Smoker    Packs/day: 1.00    Years: 5.00    Pack years: 5.00    Types: Cigarettes  . Smokeless tobacco: Never Used  Substance Use Topics  . Alcohol use: Not Currently    Frequency: Never  . Drug use: Yes    Types: Cocaine, Heroin, Hydrocodone, Oxycodone, Marijuana, Benzodiazepines, Other-see comments, Methamphetamines    Comment: heroine, xanax, crack     Allergies   Patient has no known allergies.   Review of Systems Review of Systems  All other systems reviewed and are  negative.    Physical Exam Updated Vital Signs BP 105/76 (BP Location: Left Arm)   Pulse 76   Temp 98.1 F (36.7 C) (Oral)   Resp 18   Ht 5\' 1"  (1.549 m)   Wt 52 kg   LMP 11/25/2017 (Within Weeks)   SpO2 100%   BMI 21.66 kg/m   Physical Exam Vitals signs and nursing note reviewed.  Constitutional:      Appearance: She is well-developed.  HENT:     Head: Normocephalic.  Neck:     Musculoskeletal: Normal range of motion.  Pulmonary:     Effort: Pulmonary effort is normal.  Abdominal:     General: There is no distension.     Tenderness: There is no abdominal tenderness.  Musculoskeletal: Normal range of motion.  Neurological:     Mental Status: She is alert and oriented to person, place, and time.      ED Treatments / Results  Labs (all labs ordered are listed, but only abnormal results are displayed) Labs Reviewed - No data to display  EKG None  Radiology No results found.  Procedures Ultrasound ED OB Pelvic Date/Time: 02/15/2018 2:29 PM Performed by: Azalia Bilisampos, Seynabou Fults, MD Authorized by: Azalia Bilisampos, Natacha Jepsen, MD   Procedure details:    Indications: evaluate for IUP     Assess:  Intrauterine pregnancy   Technique:  Transabdominal obstetric (HCG+) exam   Images: archived    Uterine findings:    Intrauterine pregnancy: identified     Single gestation: identified     Gestational sac: identified     Fetal pole: identified     Fetal heart rate: identified     Estimated gestational age: 59 weeks Left ovary findings:    Adnexal mass: not identified Right ovary findings:     Adnexal mass: not identified Other findings:    Free pelvic fluid: not identified     Free peritoneal fluid: not identified   Comments:     Heart rate 176   (including critical care time)  Medications Ordered in ED Medications - No data to display   Initial Impression / Assessment and Plan / ED Course  I have reviewed the triage vital signs and the nursing notes.  Pertinent labs &  imaging results that were available during my care of the patient were reviewed by me and considered in my medical decision making (see chart for details).     Viable intrauterine single gestation.  Crown-rump length measures first trimester.  Obstetrical follow-up.  Final Clinical Impressions(s) / ED Diagnoses   Final diagnoses:  First trimester pregnancy    ED Discharge Orders    None       Azalia Bilisampos, Bradrick Kamau, MD 02/15/18 1431

## 2018-05-23 ENCOUNTER — Other Ambulatory Visit: Payer: Self-pay

## 2018-05-23 ENCOUNTER — Inpatient Hospital Stay (HOSPITAL_COMMUNITY)
Admission: AD | Admit: 2018-05-23 | Discharge: 2018-05-23 | Disposition: A | Discharge status: Home / Self Care | Payer: Medicaid Other | Attending: Obstetrics & Gynecology | Admitting: Obstetrics & Gynecology

## 2018-05-23 ENCOUNTER — Encounter (HOSPITAL_COMMUNITY): Payer: Self-pay | Admitting: *Deleted

## 2018-05-23 DIAGNOSIS — R109 Unspecified abdominal pain: Secondary | ICD-10-CM | POA: Insufficient documentation

## 2018-05-23 DIAGNOSIS — O99342 Other mental disorders complicating pregnancy, second trimester: Secondary | ICD-10-CM | POA: Diagnosis not present

## 2018-05-23 DIAGNOSIS — Z87891 Personal history of nicotine dependence: Secondary | ICD-10-CM | POA: Diagnosis not present

## 2018-05-23 DIAGNOSIS — K30 Functional dyspepsia: Secondary | ICD-10-CM

## 2018-05-23 DIAGNOSIS — O99512 Diseases of the respiratory system complicating pregnancy, second trimester: Secondary | ICD-10-CM | POA: Diagnosis present

## 2018-05-23 DIAGNOSIS — R05 Cough: Secondary | ICD-10-CM | POA: Diagnosis not present

## 2018-05-23 DIAGNOSIS — F4310 Post-traumatic stress disorder, unspecified: Secondary | ICD-10-CM | POA: Insufficient documentation

## 2018-05-23 DIAGNOSIS — Z3A25 25 weeks gestation of pregnancy: Secondary | ICD-10-CM | POA: Diagnosis not present

## 2018-05-23 DIAGNOSIS — F1910 Other psychoactive substance abuse, uncomplicated: Secondary | ICD-10-CM | POA: Diagnosis not present

## 2018-05-23 DIAGNOSIS — O9989 Other specified diseases and conditions complicating pregnancy, childbirth and the puerperium: Secondary | ICD-10-CM | POA: Insufficient documentation

## 2018-05-23 DIAGNOSIS — J302 Other seasonal allergic rhinitis: Secondary | ICD-10-CM

## 2018-05-23 DIAGNOSIS — J029 Acute pharyngitis, unspecified: Secondary | ICD-10-CM | POA: Diagnosis not present

## 2018-05-23 DIAGNOSIS — J45909 Unspecified asthma, uncomplicated: Secondary | ICD-10-CM | POA: Diagnosis not present

## 2018-05-23 LAB — URINALYSIS, ROUTINE W REFLEX MICROSCOPIC
Bilirubin Urine: NEGATIVE
Glucose, UA: NEGATIVE mg/dL
Hgb urine dipstick: NEGATIVE
Ketones, ur: NEGATIVE mg/dL
Nitrite: NEGATIVE
Protein, ur: NEGATIVE mg/dL
Specific Gravity, Urine: 1.017 (ref 1.005–1.030)
pH: 8 (ref 5.0–8.0)

## 2018-05-23 LAB — INFLUENZA PANEL BY PCR (TYPE A & B)
Influenza A By PCR: NEGATIVE
Influenza B By PCR: NEGATIVE

## 2018-05-23 LAB — GROUP A STREP BY PCR: Group A Strep by PCR: NOT DETECTED

## 2018-05-23 MED ORDER — ACETAMINOPHEN 500 MG PO TABS
1000.0000 mg | ORAL_TABLET | Freq: Once | ORAL | Status: AC
  Administered 2018-05-23: 1000 mg via ORAL
  Filled 2018-05-23: qty 2

## 2018-05-23 MED ORDER — LORATADINE 10 MG PO TABS: 10.0000 mg | ORAL_TABLET | Freq: Every day | ORAL | 1 refills | Status: AC

## 2018-05-23 MED ORDER — LORATADINE 10 MG PO TABS
10.0000 mg | ORAL_TABLET | Freq: Every day | ORAL | Status: DC
  Administered 2018-05-23: 10 mg via ORAL
  Filled 2018-05-23: qty 1

## 2018-05-23 MED ORDER — ALUM & MAG HYDROXIDE-SIMETH 200-200-20 MG/5ML PO SUSP
30.0000 mL | Freq: Once | ORAL | Status: AC
  Administered 2018-05-23: 30 mL via ORAL
  Filled 2018-05-23: qty 30

## 2018-05-23 MED ORDER — LIDOCAINE VISCOUS HCL 2 % MT SOLN
15.0000 mL | Freq: Once | OROMUCOSAL | Status: AC
  Administered 2018-05-23: 15 mL via ORAL
  Filled 2018-05-23: qty 15

## 2018-05-23 NOTE — MAU Note (Signed)
Pt reports to MAU c/o upper mid abdominal pain that is 10/10 sharp and stabbing and SOB. Pt reports she just got back from [facility] pt states she just got back two days ago and experienced these symptoms for 1 week while she was there. No bleeding or LOF. +FM. Pt reports no fever but has cough, sore thorat, abdominal pain, back pain, weakness.

## 2018-05-23 NOTE — MAU Provider Note (Signed)
History     CSN: [id number]  Arrival date and time: 05/23/18 6962   First Provider Initiated Contact with Patient 05/23/18 2040      No chief complaint on file.  Deborah Boone is a 23 y.o. G1P0 at [redacted]w[redacted]d who presents for abdominal pain, SOB, and sore throat.  Patient reports her symptoms have been ongoing for the last week.  She states the abdominal pain is intermittent, but is a sharp stabbing pain when it occurs and causes SOB.  She states there are no aggravating or relieving factors and "the pain just comes."  Patient states she has taken tylenol, but none today and when she did take it she did not experience relief.  Patient endorses seasonal allergies, but has not taken any medications.  She states that her cough is non productive and provider has not witnessed any coughing during assessment.  Patient endorses good fetal movement and denies pregnancy or vaginal concerns.  However, patient does report diarrhea today and states she has only drank 2 boost drinks as she feels she does not have an appetite. Patient endorses recent travel from [facility] stating she was living their the past two months and moved back to [place] last week.         OB History    Gravida  1   Para      Term      Preterm      AB      Living        SAB      TAB      Ectopic      Multiple      Live Births              Past Medical History:  Diagnosis Date  . Anxiety   . Asthma   . Hearing loss in right ear    Pt reports hearing loss in right ear  . Polysubstance abuse (HCC)   . PTSD (post-traumatic stress disorder)   . Small bowel obstruction (HCC)   . Vision abnormalities    Pt wears glasses    Past Surgical History:  Procedure Laterality Date  . COLONOSCOPY WITH PROPOFOL N/A 08/20/2015   Procedure: COLONOSCOPY WITH PROPOFOL;  Surgeon: Graylin Shiver, MD;  Location: [facility];  Service: Endoscopy;  Laterality: N/A;  . FRACTURE SURGERY    . HIP SURGERY    . SPLENECTOMY,  TOTAL      No family history on file.  Social History   Tobacco Use  . Smoking status: Former Smoker    Packs/day: 1.00    Years: 5.00    Pack years: 5.00    Types: Cigarettes  . Smokeless tobacco: Never Used  Substance Use Topics  . Alcohol use: Not Currently    Frequency: Never  . Drug use: Not Currently    Types: Cocaine, Heroin, Hydrocodone, Oxycodone, Marijuana, Benzodiazepines, Other-see comments, Methamphetamines    Comment: heroine, xanax, crack    Allergies: No Known Allergies  Medications Prior to Admission  Medication Sig Dispense Refill Last Dose  . Prenatal Vit-Fe Fumarate-FA (PRENATAL COMPLETE) 14-0.4 MG TABS Take 1 tablet by mouth daily. (May buy from over the counter or use your home supply): Vitamin supplementation 60 each 0 05/23/2018 at Unknown time  . buprenorphine-naloxone (SUBOXONE) 2-0.5 mg SUBL SL tablet Place 2 tablets under the tongue 3 (three) times daily. (See the hand written prescription): For opioid dependence during pregnancy. (Patient not taking: Reported on 02/15/2018) 1 tablet 0  Not Taking at Unknown time  . cephALEXin (KEFLEX) 500 MG capsule Take 1 capsule (500 mg total) by mouth every 12 (twelve) hours. For infection (Patient not taking: Reported on 02/15/2018)   Not Taking at Unknown time  . FLUoxetine (PROZAC) 20 MG capsule Take 1 capsule (20 mg total) by mouth daily. For depression (Patient not taking: Reported on 02/15/2018) 30 capsule 0 Not Taking at Unknown time  . nicotine polacrilex (NICORETTE) 2 MG gum Take 1 each (2 mg total) by mouth as needed for smoking cessation. (may buy from over the counter): For smoking cessation (Patient not taking: Reported on 02/15/2018) 100 tablet 0 Not Taking at Unknown time    Review of Systems  Constitutional: Negative for chills, diaphoresis and fever.  HENT: Positive for rhinorrhea and sore throat. Negative for congestion.   Respiratory: Positive for cough and shortness of breath (When stomach pain  occurs. ).   Gastrointestinal: Positive for abdominal pain and diarrhea. Negative for constipation.   Physical Exam   Blood pressure 123/60, pulse 84, temperature 98.4 F (36.9 C), temperature source Oral, resp. rate 19, height 5' (1.524 m), weight 59.1 kg, last menstrual period 11/25/2017, SpO2 100 %.  Physical Exam  Constitutional: She is oriented to person, place, and time. She appears well-developed and well-nourished. No distress.  HENT:  Head: Normocephalic and atraumatic.  Eyes: Conjunctivae are normal.  Neck: Normal range of motion.  Cardiovascular: Normal rate, regular rhythm and normal heart sounds.  Respiratory: Effort normal and breath sounds normal. No respiratory distress. She has no wheezes. She has no rales.  GI: Soft.  Musculoskeletal: Normal range of motion.  Neurological: She is alert and oriented to person, place, and time.  Skin: Skin is warm and dry. She is not diaphoretic.  Psychiatric: She has a normal mood and affect. Her behavior is normal.    Fetal Assessment 145 bpm, Mod Var, -Decels, -Accels Toco: None graphed  MAU Course   Results for orders placed or performed during the hospital encounter of 05/23/18 (from the past 24 hour(s))  Urinalysis, Routine w reflex microscopic     Status: Abnormal   Collection Time: 05/23/18  7:23 PM  Result Value Ref Range   Color, Urine YELLOW YELLOW   APPearance TURBID (A) CLEAR   Specific Gravity, Urine 1.017 1.005 - 1.030   pH 8.0 5.0 - 8.0   Glucose, UA NEGATIVE NEGATIVE mg/dL   Hgb urine dipstick NEGATIVE NEGATIVE   Bilirubin Urine NEGATIVE NEGATIVE   Ketones, ur NEGATIVE NEGATIVE mg/dL   Protein, ur NEGATIVE NEGATIVE mg/dL   Nitrite NEGATIVE NEGATIVE   Leukocytes,Ua LARGE (A) NEGATIVE   RBC / HPF 0-5 0 - 5 RBC/hpf   WBC, UA 6-10 0 - 5 WBC/hpf   Bacteria, UA MANY (A) NONE SEEN   Squamous Epithelial / LPF 21-50 0 - 5   Mucus PRESENT    Budding Yeast PRESENT    Amorphous Crystal PRESENT   Influenza panel  by PCR (type A & B)     Status: None   Collection Time: 05/23/18  8:33 PM  Result Value Ref Range   Influenza A By PCR NEGATIVE NEGATIVE   Influenza B By PCR NEGATIVE NEGATIVE  Group A Strep by PCR     Status: None   Collection Time: 05/23/18  8:33 PM  Result Value Ref Range   Group A Strep by PCR NOT DETECTED NOT DETECTED   No results found.  MDM PE Labs: Influenza Panel, UA, Strep, UC GI  Cocktail Antihistamine Pain Medication Assessment and Plan  23 year old female G1P0 at 25.4 weeks Stomach pain Sore Throat/Cough Known Seasonal Allergies  -Exam findings discussed -Will send influenza and strep-stat -Contact/Droplet precautions for symptoms -Patient and mother informed that symptoms are c/w allergies and indigestion, but r/o testing would be performed. -No questions or concerns regarding list -Will give GI cocktail and reassess for stomach pain.  Follow Up 2105 Joni Reining from IP consulted and advised *No need for further testing as patient symptoms not completely inclusive for Co-Vid 19. *Suggested patient be discharged, when appropriate, and encouraged to rest, hydrate, and report any new onset of symptoms or worsening of symptoms.    Follow Up (9:35 PM) Seasonal Allergies  -Strep and Influenza returns negative -Will send urine for culture -Give tylenol 1000mg  now  -Give claritin 10mg  now   Follow Up (9:56 PM) -In room to reassess and inform of results. -Patient reports improvement of abdominal pain s/p GI cocktail. -Instructed to take tums at home for indigestion, but can discuss at NOB visit if need for stronger medications necessary.  -Questions regarding NOB visit addressed and patient mother encouraged to call clinic and get information. -Rx for Claritin 10mg  daily, Dispense 30 RF 1 -Encouraged to call if any questions or concerns arise prior to next scheduled office visit.  -Discharged to home in improved condition  Cherre Robins MSN, CNM 05/23/2018,  8:40 PM

## 2018-05-23 NOTE — Discharge Instructions (Signed)

## 2018-05-25 LAB — CULTURE, OB URINE: Culture: 30000 — AB

## 2018-05-28 ENCOUNTER — Ambulatory Visit: Payer: Self-pay | Admitting: Clinical

## 2018-05-28 ENCOUNTER — Encounter: Payer: Self-pay | Admitting: Student

## 2018-05-28 ENCOUNTER — Other Ambulatory Visit: Payer: Self-pay

## 2018-05-28 ENCOUNTER — Ambulatory Visit (INDEPENDENT_AMBULATORY_CARE_PROVIDER_SITE_OTHER): Payer: Self-pay | Admitting: Student

## 2018-05-28 ENCOUNTER — Other Ambulatory Visit (HOSPITAL_COMMUNITY)
Admission: RE | Admit: 2018-05-28 | Discharge: 2018-05-28 | Disposition: A | Discharge status: Home / Self Care | Payer: Medicaid Other | Source: Ambulatory Visit | Attending: Student | Admitting: Student

## 2018-05-28 VITALS — BP 124/72 | HR 92 | Temp 98.4°F | Wt 132.0 lb

## 2018-05-28 DIAGNOSIS — Z3A26 26 weeks gestation of pregnancy: Secondary | ICD-10-CM

## 2018-05-28 DIAGNOSIS — O99322 Drug use complicating pregnancy, second trimester: Secondary | ICD-10-CM

## 2018-05-28 DIAGNOSIS — O0932 Supervision of pregnancy with insufficient antenatal care, second trimester: Secondary | ICD-10-CM

## 2018-05-28 DIAGNOSIS — O0990 Supervision of high risk pregnancy, unspecified, unspecified trimester: Secondary | ICD-10-CM

## 2018-05-28 DIAGNOSIS — F1920 Other psychoactive substance dependence, uncomplicated: Secondary | ICD-10-CM
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
## 2018-05-28 NOTE — BH Specialist Note (Signed)
  Integrated Behavioral Health Initial Visit  MRN: [id number] Name: Deborah Boone  Number of Integrated Behavioral Health Clinician visits:: 1/6 Session Start time: 3:49  Session End time: 3:54 Total time: 15 minutes  Type of Service: Integrated Behavioral Health- Individual/Family Interpretor:No. Interpretor Name and Language: n/a   Warm Hand Off Completed.       SUBJECTIVE: Deborah Boone is a 23 y.o. female accompanied by n/a Patient was referred by Judeth Horn, NP for Initial OB introduction to integrated behavioral health services . Patient reports the following symptoms/concerns: Pt has no questions or concerns today; will consider additional visit, as needed.  Duration of problem: -; Severity of problem: Not discussed today  OBJECTIVE: Mood: Normal and Affect: Appropriate Risk of harm to self or others: No plan to harm self or others  LIFE CONTEXT: Family and Social: - School/Work: - Self-Care: - Life Changes: Current pregnancy with late PNC; move to [place] from [place], [place] in pregnancy  GOALS ADDRESSED: Patient will: 1. Increase knowledge and/or ability of: community resources available; coping with depression and anxiety   INTERVENTIONS: Interventions utilized: Psychoeducation and/or Health Education  Standardized Assessments completed: not given today  ASSESSMENT: Patient currently experiencing Supervision of high risk pregnancy, antepartum   Patient may benefit from Initial OB introduction to integrated behavioral health services .  PLAN: 1. Follow up with behavioral health clinician on : Two weeks, or earlier, as needed 2. Behavioral recommendations:  -Continue taking prenatal vitamin, as recommended by medical provider -Take home packet of helpful information today (community resources, including substance treatment options; coping with depression and anxiety) 3. Referral(s): Integrated [facility] (In Clinic) 4. "From scale of 1-10,  how likely are you to follow plan?": -  Rae Lips, LCSW

## 2018-05-28 NOTE — Patient Instructions (Signed)
AREA PEDIATRIC/FAMILY PRACTICE PHYSICIANS  [place] ([address]) . [facility] o Chambliss, MD; Eniola, MD; Hale, MD; Hensel, MD; McDiarmid, MD; McIntyer, MD; Neal, MD; Walden, MD o [address] [place], [place] [address] o [phone] o Mon-Fri 8:30-12:30, 1:30-5:00 o Providers come to see babies at [facility] o Accepting Medicaid . [facility] o Limited providers who accept newborns: Koirala, MD; Morrow, MD; Wolters, MD o [address], [place], [place] [address] o [phone] o Mon-Fri 8:00-5:30 o Babies seen by providers at [facility] o Does NOT accept Medicaid o Please call early in hospitalization for appointment (limited availability)  . [facility] o [place], MD o [address] [place], [place] [address] o [phone] o Mon, Tue, Thur, Fri 8:30-5:00, Wed 10:00-7:00 (closed 1-2pm) o Babies seen by [facility] providers o Accepting Medicaid . Rubin - Pediatrician o Rubin, MD o [address], [place], [place] [address] o [phone] o Mon-Fri 8:30-5:00, Sat 8:30-12:00 o Provider comes to see babies at [facility] o Accepting Medicaid o Must have been referred from current patients or contacted office prior to delivery . [facility] ([facility] o Brown, MD; Chandler, MD; Ettefagh, MD; Grant, MD; Lester, MD; McCormick, MD; McQueen, MD; Prose, MD; Simha, MD; Stanley, MD; Stryffeler, NP; Tebben, NP o [address], [place], [place] [address] o [phone] o Mon, Tue, Thur, Fri 8:30-5:30, Wed 9:30-5:30, Sat 8:30-12:30 o Babies seen by [facility] providers o Accepting Medicaid o Only accepting infants of first-time parents or siblings of current patients o Hospital discharge coordinator will make follow-up appointment . Jack Amos o [address],  [place], [place]  [address] o [phone]   Fax - [phone] . [facility] o [address], [place], [place]  [address] o Phone - [phone]   Fax - [phone] . Shilpa Gosrani o [address], [place], [place]  [address] o [phone]  [place] ([address]) . [facility] o Bates, MD; [place], MD; Cooper, Cox, MD; MD; Davis, MD; Dovico, MD; Ettefaugh, MD; Little, MD; Lowe, MD; Keiffer, MD; Melvin, MD; Sumner, MD; Williams, MD o [address], [place], [place] [address] o [phone] o Mon-Fri 8:30-5:00 (extended evenings Mon-Thur as needed), Sat-Sun 10:00-1:00 o Providers come to see babies at [facility] o Accepting Medicaid for families of first-time babies and families with all children in the household age 3 and under. Must register with office prior to making appointment (M-F only). . [facility] o Henson, NP; Knapp, MD; Lalonde, MD; Tysinger, PA o [address] [place], [place] [address] o [phone] o Mon-Fri 8:00-5:00 o Babies seen by providers at [facility] o Does NOT accept Medicaid/Commercial Insurance Only . [facility] ([facility]  o Artis, MD; Barnes, MD; Bratton, MD; Coccaro, MD; Lockett Gardner, MD; Kramer, MD; Marshall, MD; Netherton, MD; Poleto, MD; Skinner, MD o [address] [place], [place] [address] o [phone] o Mon-Fri 8:30-5:30, Sat (Oct.-Mar.) 9:00-1:00 o Babies seen by providers at [facility] o Accepting Medicaid  [place] ([address]) . [facility] o Reid, MD; Warner, MD o [address], [place],  [address] o [phone] o Mon-Fri 8:30-5:00, Sat 8:30-12:00 o Providers come to see babies at [facility] o Does NOT accept Medicaid . [facility] Ricci Barker, PA; Mannie Stabile, MD; [place], [place]; Nancy Fetter, MD; Moreen Fowler, [place],  [place], [place] [address] o [phone] o Mon-Fri 8:00-5:00 o Babies seen by providers at [facility] o Does NOT accept Medicaid o Only accepting babies of parents who are patients o Please call early in hospitalization for appointment (limited availability) . [facility] Blanca Friend, MD; Sharlene Motts, MD; Rod Can, MD; Warner Mccreedy, NP; Sabra Heck, MD; Ermalinda Memos, MD; Sharlett Iles, NP; Aurther Loft, MD; Jerrye Beavers, MD; Marcello Moores, MD; Berline Lopes, MD; Charolette Forward, MD o [place]. [place], [place], [place] [address] o [phone] o Mon-Fri 8:00-5:00, Sat 9:00-12:00 o Providers come to see babies at [facility] o Does NOT accept [facility] [phone]) . [facility] providers accepting new patients: Dayna Ramus, NP; [place], [place], [place], [place] [address] o [phone] o Mon-Fri 8:00-5:00 o Babies seen by providers at [facility] o Does NOT accept Medicaid o Only accepting babies of parents who are patients o Please call early in hospitalization for appointment (limited availability) . [facility] Oswaldo Conroy, MD; Sheran Lawless, MD o [address] [place], [place] [address] o [phone] (press 1 to schedule appointment) o Mon-Fri 8:00-5:00 o Providers come to see babies at [facility] o Does NOT accept Medicaid . [facility] Jodi Mourning, MD o [address] [place], [place] [address] o [phone] o Mon-Fri 8:30-5:00 (lunch 12:30-1:00), extended hours by appointment only Wed 5:00-6:30 o Babies seen by [facility] providers o Accepting Medicaid . [place] [facility] Evalyn Casco, MD; [place], MD; Ethlyn Gallery, MD o [place], [place], [place] [address] o [phone] o Mon-Fri 8:00-5:00 o Babies seen by [facility] providers o Does NOT accept Medicaid . Therapist, music at [place], MD; Yong Channel, MD; [place], [place] [address] [place], [place] [address] o  [phone] o Mon-Fri 8:00-5:00 o Babies seen by [facility] providers o Does NOT accept Medicaid . [place], [place]; [place], [place]; [place], NP; Albertina Parr, MD; Frederic Jericho, MD; Ronney Lion, MD; Carlos Levering, NP; Jerelene Redden, NP; Tomasita Crumble, NP; Ronelle Nigh, NP; Corinna Lines, MD; [place], MD o [address] [place], [place] [address] o [phone] o Mon-Fri 8:30-5:00, Sat 10:00-1:00 o Providers come to see babies at [facility] o Does NOT accept Medicaid o Free prenatal information session Tuesdays at 4:45pm . [facility] Porfirio Oar, MD; [place], [place]; [place], [place]; [place], [place]., [place] [address] o [phone] o Mon-Fri 7:30-5:30 o Babies seen by [facility] providers . [facility] Doctor o [address], [place], [place], [place]  [address] o [phone]   Fax - [phone]  [place] [phone] & [phone]) . [place], [place] o [address] [place], [place] [address] o [phone] o Mon-Thur 8:00-6:00 o Providers come to see babies at [place] Medicaid . [place], NP; Melford Aase, MD; [place], [place]; [place], [address] [place], [place] [address] o [phone] o Mon-Thur 7:30-7:30, Fri 7:30-4:30 o Babies seen by [facility] providers o Accepting Medicaid . [facility] Nyra Jabs, MD; Cristino Martes, NP; Gertie Baron, MD o [place] [address], [place], [place] [address] o [phone] o Mon-Fri 8:30-5:00, Sat 8:30-12:00 o Providers come to see babies at [place] Medicaid o Must have "Meet & Greet" appointment at office prior to delivery . [place] ([place]) o Hubbard,  MD; [name], MD; Clydene Laming, [place] [address], [place], [place] [address] o [phone] o Mon-Wed 8:00-6:00, Thur-Fri 8:00-5:00, Sat 9:00-12:00 o Providers come to see  babies at [facility] o Does NOT accept Medicaid o Only accepting siblings of current patients . [facility], [place], [place], [place]  [address] o [phone]   Fax [phone] . [place] [facility] [place] [address], [place], [place]  [address] o [phone]   Fax - Artois Craig [phone] & [phone]) . Therapist, music at [place], DO; [place], [address] [place], [place] [address] o [phone] o Mon-Fri 7:00-5:00 o Babies seen by [facility] providers o Does NOT accept Medicaid . Cochrane, MD; [place], [place]; [place], [place], [place], [place] [address] o [phone] o Mon-Fri 8:00-5:00 o Babies seen by [facility] providers o Accepting Medicaid . Reed, MD; [place], [place]; [place], NP; [place], [place], [place], [place] [address] o [phone] o Mon-Fri 8:00-5:00 o Babies seen by providers at [facility] [place] [phone]) . [facility] [place], [place] o [address] [place], [place] [address] o [phone] o Mon-Fri 8:00-5:00 o Babies seen by [facility] providers o Does NOT accept Medicaid o Limited availability, please call early in hospitalization to schedule follow-up . [facility] Leilani Merl, PA; Maisie Fus, MD; [place], MD; [place], [place]; Jeannine Kitten, MD; [place], [place]y [facility] [address], [place], [place] [address] o [phone] o Mon-Fri 8:30-5:00, Sat 9:00-12:00 o Babies seen by providers at [facility] o Accepting Medicaid o Please register online then schedule online or call office o [email or web address] . [place] [facility] [place]) Kristian Covey, NP; Dwyane Dee, MD; Leonidas Romberg, PA o [address] [place], [place], [place] [address] o [phone] o Mon-Fri 8:00-5:00 o Babies seen by providers at [facility] o Accepting Medicaid . [place] ([facility]) Dairl Ponder, MD; Rayvon Char, NP; Melina Modena, MD o [address] [place], [place], [place] [address] o [phone] o Mon-Fri 8:00-5:30, Sat&Sun by appointment (phones open at 8:30) o Babies seen by [facility] providers o Accepting Medicaid o Must be a first-time baby or sibling of current patient . [place], [address], [place], [place]  [address] o [phone]   Fax - [phone]  [place] [phone] & [phone]) . [place], [place]; [place], [place]; Benjamine Mola, MD; [place], [place]; Harrell Lark, MD o [address], [place], [place] [address] o [phone] o Mon-Thur 8:00-7:00, Fri 8:00-5:00, Sat 8:00-12:00, Sun 9:00-12:00 o Babies seen by [facility] providers o Accepting Medicaid . [facility], MD; Ruthann Cancer, MD; [facility], MD o [address], [place],  [address] o [phone] o Mon-Thur 8:00-5:00 o Babies seen by providers at [facility] o Accepting Medicaid . [facility] [place], MD; Coe-Goins, MD; Amedeo Plenty, MD; Bobby Rumpf, MD; List, MD; Lavonia Drafts, MD; Ruthann Cancer, MD; Selinda Eon, MD; Audie Box, MD; Jim Like, MD; Christie Nottingham, MD; [place], MD; [place]ena Nunnery, MD o [address] [place], [place]  [address] o [phone] o Mon-Fri 8:00-5:30, Sat (Oct.-Mar.) 9:00-1:00 o Babies seen by providers at [facility] o Accepting Medicaid o Must fill out new patient packet, available online at [email or web address] . [facility] ([facility]) o Friddle, NP; Harris, NP; Kelly, NP; Logan, MD;  Melvin, PA; Poth, MD; Ramadoss, MD; Stanton, NP o [address], [place], [place] [address] o [phone] o Mon-Thur 8:00-5:30, Fri 8:00-5:00 o Babies seen by providers at [facility] o Accepting Medicaid  [place] ([address]) . [facility] o Dixon, PA; [place], MD; Pickard, MD; Tapia, PA o [address], [place], [place] [address] o [phone] o Mon-Fri 8:00-5:00 o Babies seen by providers at [facility] o Accepting Medicaid   [place] ([address]) . [facility] [place] o Masneri, DO; Meyers, MD; Nelson, PA o [address], [place], [place] [address] o [phone] o Mon-Fri 8:00-5:00 o Babies seen by providers at [facility] o Does NOT accept Medicaid o Limited appointment availability, please call early in hospitalization  . [facility] [place] o Kunedd, DO; McGowen, MD o [address], [place], [place] [address] o [phone] o Mon-Fri 8:00-5:00 o Babies seen by [facility] providers o Does NOT accept Medicaid . [facility] [place] o Cameron, MD; MacDonald, MD; Michaels, PA; Nayak, MD o [address] [place] [address], [place], [place] [address] o [phone] o Mon-Fri 8:00-5:00 o After hours clinic ([address] , [place] [address]) [phone] Mon-Fri 5:00-8:00, Sat 12:00-6:00, Sun 10:00-4:00 o Babies seen by [facility] providers o Accepting Medicaid . [facility] [place] o [address], [place], [place]  [address] o [phone]   Fax - [phone]  [place] ([address]) . [facility] [place] [facility] o Andy, MD o [address], [place], [place] [address] o [phone] o Mon-Fri 8:00-5:00 o Babies seen by [facility] providers o Does NOT accept Medicaid . [facility] [place] ([facility] [place]) o Eksir, MD o [address], [place], [place]  [address] o [phone] o Mon-Thur 8:00-7:00, Fri 8:00-5:00, Sat 8:00-12:00 o Babies seen by providers at [facility] o Accepting Medicaid - but does not have vaccinations in office (must be received elsewhere) o Limited availability, please call early in hospitalization  [place] ([address]) . [facility]  o Charlene Flemming, MD o [address], [place]  [address] o [phone]  Fax [phone]       Third Trimester of Pregnancy The third trimester is from week 28 through week 40 (months 7 through 9). The third trimester is a time when the unborn baby (fetus) is growing rapidly. At the end of the ninth month, the fetus is about 20 inches in length and weighs 6-10 pounds. Body changes during your third trimester Your body will continue to go through many changes during pregnancy. The changes vary from woman to woman. During the third trimester:  Your weight will continue to increase. You can expect to gain 25-35 pounds (11-16 kg) by the end of the pregnancy.  You may begin to get stretch marks on your hips, abdomen, and breasts.  You may urinate more often because the fetus is moving lower into your pelvis and pressing on your bladder.  You may develop or continue to have heartburn. This is caused by increased hormones that slow down muscles in the digestive   tract.  You may develop or continue to have constipation because increased hormones slow digestion and cause the muscles that push waste through your intestines to relax.  You may develop hemorrhoids. These are swollen veins (varicose veins) in the rectum that can itch or be painful.  You may develop swollen, bulging veins (varicose veins) in your legs.  You may have increased body aches in the pelvis, back, or thighs. This is due to weight gain and increased hormones that are relaxing your joints.  You may have changes in your hair. These can include thickening of your hair, rapid growth, and changes  in texture. Some women also have hair loss during or after pregnancy, or hair that feels dry or thin. Your hair will most likely return to normal after your baby is born.  Your breasts will continue to grow and they will continue to become tender. A yellow fluid (colostrum) may leak from your breasts. This is the first milk you are producing for your baby.  Your belly button may stick out.  You may notice more swelling in your hands, face, or ankles.  You may have increased tingling or numbness in your hands, arms, and legs. The skin on your belly may also feel numb.  You may feel short of breath because of your expanding uterus.  You may have more problems sleeping. This can be caused by the size of your belly, increased need to urinate, and an increase in your body's metabolism.  You may notice the fetus "dropping," or moving lower in your abdomen (lightening).  You may have increased vaginal discharge.  You may notice your joints feel loose and you may have pain around your pelvic bone. What to expect at prenatal visits You will have prenatal exams every 2 weeks until week 36. Then you will have weekly prenatal exams. During a routine prenatal visit:  You will be weighed to make sure you and the baby are growing normally.  Your blood pressure will be taken.  Your abdomen will be measured to track your baby's growth.  The fetal heartbeat will be listened to.  Any test results from the previous visit will be discussed.  You may have a cervical check near your due date to see if your cervix has softened or thinned (effaced).  You will be tested for Group B streptococcus. This happens between 35 and 37 weeks. Your health care provider may ask you:  What your birth plan is.  How you are feeling.  If you are feeling the baby move.  If you have had any abnormal symptoms, such as leaking fluid, bleeding, severe headaches, or abdominal cramping.  If you are using any tobacco  products, including cigarettes, chewing tobacco, and electronic cigarettes.  If you have any questions. Other tests or screenings that may be performed during your third trimester include:  Blood tests that check for low iron levels (anemia).  Fetal testing to check the health, activity level, and growth of the fetus. Testing is done if you have certain medical conditions or if there are problems during the pregnancy.  Nonstress test (NST). This test checks the health of your baby to make sure there are no signs of problems, such as the baby not getting enough oxygen. During this test, a belt is placed around your belly. The baby is made to move, and its heart rate is monitored during movement. What is false labor? False labor is a condition in which you feel small, irregular tightenings of   the muscles in the womb (contractions) that usually go away with rest, changing position, or drinking water. These are called Braxton Hicks contractions. Contractions may last for hours, days, or even weeks before true labor sets in. If contractions come at regular intervals, become more frequent, increase in intensity, or become painful, you should see your health care provider. What are the signs of labor?  Abdominal cramps.  Regular contractions that start at 10 minutes apart and become stronger and more frequent with time.  Contractions that start on the top of the uterus and spread down to the lower abdomen and back.  Increased pelvic pressure and dull back pain.  A watery or bloody mucus discharge that comes from the vagina.  Leaking of amniotic fluid. This is also known as your "water breaking." It could be a slow trickle or a gush. Let your health care provider know if it has a color or strange odor. If you have any of these signs, call your health care provider right away, even if it is before your due date. Follow these instructions at home: Medicines  Follow your health care provider's  instructions regarding medicine use. Specific medicines may be either safe or unsafe to take during pregnancy.  Take a prenatal vitamin that contains at least 600 micrograms (mcg) of folic acid.  If you develop constipation, try taking a stool softener if your health care provider approves. Eating and drinking   Eat a balanced diet that includes fresh fruits and vegetables, whole grains, good sources of protein such as meat, eggs, or tofu, and low-fat dairy. Your health care provider will help you determine the amount of weight gain that is right for you.  Avoid raw meat and uncooked cheese. These carry germs that can cause birth defects in the baby.  If you have low calcium intake from food, talk to your health care provider about whether you should take a daily calcium supplement.  Eat four or five small meals rather than three large meals a day.  Limit foods that are high in fat and processed sugars, such as fried and sweet foods.  To prevent constipation: ? Drink enough fluid to keep your urine clear or pale yellow. ? Eat foods that are high in fiber, such as fresh fruits and vegetables, whole grains, and beans. Activity  Exercise only as directed by your health care provider. Most women can continue their usual exercise routine during pregnancy. Try to exercise for 30 minutes at least 5 days a week. Stop exercising if you experience uterine contractions.  Avoid heavy lifting.  Do not exercise in extreme heat or humidity, or at high altitudes.  Wear low-heel, comfortable shoes.  Practice good posture.  You may continue to have sex unless your health care provider tells you otherwise. Relieving pain and discomfort  Take frequent breaks and rest with your legs elevated if you have leg cramps or low back pain.  Take warm sitz baths to soothe any pain or discomfort caused by hemorrhoids. Use hemorrhoid cream if your health care provider approves.  Wear a good support bra to  prevent discomfort from breast tenderness.  If you develop varicose veins: ? Wear support pantyhose or compression stockings as told by your healthcare provider. ? Elevate your feet for 15 minutes, 3-4 times a day. Prenatal care  Write down your questions. Take them to your prenatal visits.  Keep all your prenatal visits as told by your health care provider. This is important. Safety  Wear your   seat belt at all times when driving.  Make a list of emergency phone numbers, including numbers for family, friends, the hospital, and police and fire departments. General instructions  Avoid cat litter boxes and soil used by cats. These carry germs that can cause birth defects in the baby. If you have a cat, ask someone to clean the litter box for you.  Do not travel far distances unless it is absolutely necessary and only with the approval of your health care provider.  Do not use hot tubs, steam rooms, or saunas.  Do not drink alcohol.  Do not use any products that contain nicotine or tobacco, such as cigarettes and e-cigarettes. If you need help quitting, ask your health care provider.  Do not use any medicinal herbs or unprescribed drugs. These chemicals affect the formation and growth of the baby.  Do not douche or use tampons or scented sanitary pads.  Do not cross your legs for long periods of time.  To prepare for the arrival of your baby: ? Take prenatal classes to understand, practice, and ask questions about labor and delivery. ? Make a trial run to the hospital. ? Visit the hospital and tour the maternity area. ? Arrange for maternity or paternity leave through employers. ? Arrange for family and friends to take care of pets while you are in the hospital. ? Purchase a rear-facing car seat and make sure you know how to install it in your car. ? Pack your hospital bag. ? Prepare the baby's nursery. Make sure to remove all pillows and stuffed animals from the baby's crib to  prevent suffocation.  Visit your dentist if you have not gone during your pregnancy. Use a soft toothbrush to brush your teeth and be gentle when you floss. Contact a health care provider if:  You are unsure if you are in labor or if your water has broken.  You become dizzy.  You have mild pelvic cramps, pelvic pressure, or nagging pain in your abdominal area.  You have lower back pain.  You have persistent nausea, vomiting, or diarrhea.  You have an unusual or bad smelling vaginal discharge.  You have pain when you urinate. Get help right away if:  Your water breaks before 37 weeks.  You have regular contractions less than 5 minutes apart before 37 weeks.  You have a fever.  You are leaking fluid from your vagina.  You have spotting or bleeding from your vagina.  You have severe abdominal pain or cramping.  You have rapid weight loss or weight gain.  You have shortness of breath with chest pain.  You notice sudden or extreme swelling of your face, hands, ankles, feet, or legs.  Your baby makes fewer than 10 movements in 2 hours.  You have severe headaches that do not go away when you take medicine.  You have vision changes. Summary  The third trimester is from week 28 through week 40, months 7 through 9. The third trimester is a time when the unborn baby (fetus) is growing rapidly.  During the third trimester, your discomfort may increase as you and your baby continue to gain weight. You may have abdominal, leg, and back pain, sleeping problems, and an increased need to urinate.  During the third trimester your breasts will keep growing and they will continue to become tender. A yellow fluid (colostrum) may leak from your breasts. This is the first milk you are producing for your baby.  False labor is a   condition in which you feel small, irregular tightenings of the muscles in the womb (contractions) that eventually go away. These are called Braxton Hicks  contractions. Contractions may last for hours, days, or even weeks before true labor sets in.  Signs of labor can include: abdominal cramps; regular contractions that start at 10 minutes apart and become stronger and more frequent with time; watery or bloody mucus discharge that comes from the vagina; increased pelvic pressure and dull back pain; and leaking of amniotic fluid. This information is not intended to replace advice given to you by your health care provider. Make sure you discuss any questions you have with your health care provider. Document Released: 02/08/2001 Document Revised: 03/22/2016 Document Reviewed: 03/22/2016 Elsevier Interactive Patient Education  2019 Elsevier Inc.  

## 2018-05-29 LAB — PROTEIN / CREATININE RATIO, URINE
Creatinine, Urine: 162.8 mg/dL
Protein, Ur: 13.5 mg/dL
Protein/Creat Ratio: 83 mg/g creat (ref 0–200)

## 2018-05-30 LAB — OBSTETRIC PANEL, INCLUDING HIV
Antibody Screen: NEGATIVE
Basophils Absolute: 0.1 10*3/uL (ref 0.0–0.2)
Basos: 0 %
EOS (ABSOLUTE): 0.2 10*3/uL (ref 0.0–0.4)
Eos: 1 %
HIV Screen 4th Generation wRfx: NONREACTIVE
Hematocrit: 30 % — ABNORMAL LOW (ref 34.0–46.6)
Hemoglobin: 9.3 g/dL — ABNORMAL LOW (ref 11.1–15.9)
Hepatitis B Surface Ag: NEGATIVE
IMMATURE GRANS (ABS): 0.8 10*3/uL — AB (ref 0.0–0.1)
Immature Granulocytes: 3 %
Lymphocytes Absolute: 4 10*3/uL — ABNORMAL HIGH (ref 0.7–3.1)
Lymphs: 17 %
MCH: 25.4 pg — ABNORMAL LOW (ref 26.6–33.0)
MCHC: 31 g/dL — ABNORMAL LOW (ref 31.5–35.7)
MCV: 82 fL (ref 79–97)
Monocytes Absolute: 2.5 10*3/uL — ABNORMAL HIGH (ref 0.1–0.9)
Monocytes: 11 %
Neutrophils Absolute: 16.3 10*3/uL — ABNORMAL HIGH (ref 1.4–7.0)
Neutrophils: 68 %
Platelets: 665 10*3/uL — ABNORMAL HIGH (ref 150–450)
RBC: 3.66 x10E6/uL — ABNORMAL LOW (ref 3.77–5.28)
RDW: 15.3 % (ref 11.7–15.4)
RPR: NONREACTIVE
Rh Factor: POSITIVE
Rubella Antibodies, IGG: 1.74 index (ref >0.99)
WBC: 23.8 10*3/uL (ref 3.4–10.8)

## 2018-05-30 LAB — COMPREHENSIVE METABOLIC PANEL
ALT: 10 IU/L (ref 0–32)
AST: 14 IU/L (ref 0–40)
Albumin/Globulin Ratio: 1.1 — ABNORMAL LOW (ref 1.2–2.2)
Albumin: 3.5 g/dL — ABNORMAL LOW (ref 3.9–5.0)
Alkaline Phosphatase: 59 IU/L (ref 39–117)
BUN/Creatinine Ratio: 19 (ref 9–23)
BUN: 14 mg/dL (ref 6–20)
Bilirubin Total: <0.2 mg/dL (ref 0.0–1.2)
CO2: 18 mmol/L — ABNORMAL LOW (ref 20–29)
Calcium: 9.8 mg/dL (ref 8.7–10.2)
Chloride: 105 mmol/L (ref 96–106)
Creatinine, Ser: 0.73 mg/dL (ref 0.57–1.00)
GFR calc Af Amer: 136 mL/min/{1.73_m2} (ref >59)
GFR calc non Af Amer: 118 mL/min/{1.73_m2} (ref >59)
Globulin, Total: 3.1 g/dL (ref 1.5–4.5)
Glucose: 65 mg/dL (ref 65–99)
Potassium: 4.6 mmol/L (ref 3.5–5.2)
Sodium: 138 mmol/L (ref 134–144)
TOTAL PROTEIN: 6.6 g/dL (ref 6.0–8.5)

## 2018-05-30 LAB — HEMOGLOBINOPATHY EVALUATION
Ferritin: 11 ng/mL — ABNORMAL LOW (ref 15–150)
Hgb A2 Quant: 2.2 % (ref 1.8–3.2)
Hgb A: 97.8 % (ref 96.4–98.8)
Hgb C: 0 %
Hgb F Quant: 0 % (ref 0.0–2.0)
Hgb S: 0 %
Hgb Solubility: NEGATIVE
Hgb Variant: 0 %

## 2018-05-30 LAB — DRUG SCREEN, URINE
Amphetamines, Urine: NEGATIVE ng/mL
Barbiturate screen, urine: NEGATIVE ng/mL
Benzodiazepine Quant, Ur: NEGATIVE ng/mL
COCAINE (METAB.): POSITIVE ng/mL — AB
Cannabinoid Quant, Ur: POSITIVE ng/mL — AB
OPIATE QUANT UR: NEGATIVE ng/mL
PCP Quant, Ur: NEGATIVE ng/mL

## 2018-05-30 LAB — CERVICOVAGINAL ANCILLARY ONLY
Chlamydia: NEGATIVE
NEISSERIA GONORRHEA: NEGATIVE

## 2018-05-31 LAB — CULTURE, OB URINE

## 2018-05-31 LAB — URINE CULTURE, OB REFLEX

## 2018-06-05 ENCOUNTER — Encounter: Payer: Self-pay | Admitting: *Deleted

## 2018-06-13 NOTE — BH Specialist Note (Deleted)
Integrated Behavioral Health Visit via Telemedicine (Telephone)  06/13/2018 Deborah Boone [id number]   Session Start time: ***  Session End time: *** Total time: {IBH Total PSUG:64847207}  Referring Provider: *** Type of Visit: Telephonic Patient location: *** [facility] Provider location: *** All persons participating in visit: ***  Confirmed patient's address: {YES/NO:[address]} Confirmed patient's phone number: {YES/NO:[address]197} Any changes to demographics: {YES/NO:21197}  Confirmed patient's insurance: {YES/NO:21197} Any changes to patient's insurance: {YES/NO:21197}  Discussed confidentiality: {YES/NO:21197}   The following statements were read to the patient and/or legal guardian that are established with the [facility] Provider.  "The purpose of this phone visit is to provide behavioral health care while limiting exposure to the coronavirus (COVID19).  There is a possibility of technology failure and discussed alternative modes of communication if that failure occurs."  "By engaging in this telephone visit, you consent to the provision of healthcare.  Additionally, you authorize for your insurance to be billed for the services provided during this telephone visit."   Patient and/or legal guardian consented to telephone visit: {YES/NO:21197}  PRESENTING CONCERNS: Patient and/or family reports the following symptoms/concerns: *** Duration of problem: ***; Severity of problem: {Mild/Moderate/Severe:20260}  STRENGTHS (Protective Factors/Coping Skills): ***  GOALS ADDRESSED: Patient will: 1.  Reduce symptoms of: {IBH Symptoms:21014056}  2.  Increase knowledge and/or ability of: {IBH Patient Tools:21014057}  3.  Demonstrate ability to: {IBH Goals:21014053}  INTERVENTIONS: Interventions utilized:  {IBH Interventions:21014054} Standardized Assessments completed: {IBH Screening Tools:21014051}  ASSESSMENT: Patient currently experiencing ***.   Patient may benefit from  ***.  PLAN: 1. Follow up with behavioral health clinician on : *** 2. Behavioral recommendations: *** 3. Referral(s): {IBH Referrals:21014055}  Deborah Boone C Haze Antillon

## 2018-06-14 ENCOUNTER — Encounter: Payer: Self-pay | Admitting: Obstetrics & Gynecology

## 2018-06-14 ENCOUNTER — Other Ambulatory Visit: Payer: Self-pay

## 2018-06-14 ENCOUNTER — Other Ambulatory Visit: Payer: Self-pay | Admitting: General Practice

## 2018-06-14 ENCOUNTER — Telehealth: Payer: Self-pay | Admitting: Clinical

## 2018-06-14 DIAGNOSIS — O0932 Supervision of pregnancy with insufficient antenatal care, second trimester: Secondary | ICD-10-CM

## 2018-06-14 NOTE — Telephone Encounter (Signed)
Left HIPPA-compliant message to call back Jamie from [facility] at [phone].   

## 2018-06-18 ENCOUNTER — Telehealth: Payer: Self-pay | Admitting: Family Medicine

## 2018-06-18 NOTE — Telephone Encounter (Signed)
The patients grandmother called to reschedule the appointment missed. Stated her granddaughter was out of town. Also has some concerns about irration issues. Sending a message to the nurse. Also informed of new address.

## 2018-06-19 ENCOUNTER — Other Ambulatory Visit: Payer: Self-pay

## 2018-06-19 ENCOUNTER — Inpatient Hospital Stay (HOSPITAL_COMMUNITY)
Admission: AD | Admit: 2018-06-19 | Discharge: 2018-06-19 | Disposition: A | Discharge status: Home / Self Care | Payer: Medicaid Other | Attending: Family Medicine | Admitting: Family Medicine

## 2018-06-19 ENCOUNTER — Encounter (HOSPITAL_COMMUNITY): Payer: Self-pay

## 2018-06-19 DIAGNOSIS — O99323 Drug use complicating pregnancy, third trimester: Secondary | ICD-10-CM | POA: Diagnosis not present

## 2018-06-19 DIAGNOSIS — O23593 Infection of other part of genital tract in pregnancy, third trimester: Secondary | ICD-10-CM | POA: Diagnosis not present

## 2018-06-19 DIAGNOSIS — Z3689 Encounter for other specified antenatal screening: Secondary | ICD-10-CM | POA: Diagnosis present

## 2018-06-19 DIAGNOSIS — F1920 Other psychoactive substance dependence, uncomplicated: Secondary | ICD-10-CM | POA: Insufficient documentation

## 2018-06-19 DIAGNOSIS — F4310 Post-traumatic stress disorder, unspecified: Secondary | ICD-10-CM | POA: Diagnosis not present

## 2018-06-19 DIAGNOSIS — O99513 Diseases of the respiratory system complicating pregnancy, third trimester: Secondary | ICD-10-CM | POA: Diagnosis not present

## 2018-06-19 DIAGNOSIS — Z87891 Personal history of nicotine dependence: Secondary | ICD-10-CM | POA: Diagnosis not present

## 2018-06-19 DIAGNOSIS — Z3A29 29 weeks gestation of pregnancy: Secondary | ICD-10-CM | POA: Diagnosis not present

## 2018-06-19 DIAGNOSIS — F332 Major depressive disorder, recurrent severe without psychotic features: Secondary | ICD-10-CM | POA: Insufficient documentation

## 2018-06-19 DIAGNOSIS — A599 Trichomoniasis, unspecified: Secondary | ICD-10-CM

## 2018-06-19 DIAGNOSIS — O99343 Other mental disorders complicating pregnancy, third trimester: Secondary | ICD-10-CM | POA: Insufficient documentation

## 2018-06-19 DIAGNOSIS — O98813 Other maternal infectious and parasitic diseases complicating pregnancy, third trimester: Secondary | ICD-10-CM | POA: Diagnosis not present

## 2018-06-19 DIAGNOSIS — J45909 Unspecified asthma, uncomplicated: Secondary | ICD-10-CM | POA: Insufficient documentation

## 2018-06-19 LAB — WET PREP, GENITAL
Sperm: NONE SEEN
Yeast Wet Prep HPF POC: NONE SEEN

## 2018-06-19 MED ORDER — METRONIDAZOLE 500 MG PO TABS
2000.0000 mg | ORAL_TABLET | Freq: Once | ORAL | Status: AC
  Administered 2018-06-19: 20:00:00 2000 mg via ORAL
  Filled 2018-06-19: qty 4

## 2018-06-19 NOTE — MAU Note (Signed)
Pt is unable to provide urine sample Thalia Bloodgood, CNM aware and is okay with pt not providing one.

## 2018-06-19 NOTE — MAU Provider Note (Signed)
History     CSN: [id number]  Arrival date and time: 06/19/18 1827   None     Chief Complaint  Patient presents with  . Vaginal Discharge   HPI Deborah Boone is a 23 y.o. G3P0020 at 8535w3d who presents to MAU with chief complaint of abnormal vaginal discharge. This is a new problem, onset two days ago. Patient denies vaginal bleeding, leaking of fluid, decreased fetal movement, fever, falls, or recent illness.  She also denies recent sexual intercourse, exposure to new detergents, soap or irritants.  Prior to discharge, patient shared with RN that she and her partner had been diagnosed and treated for Trichomonas in January but had intercourse the same day they were treated.  OB History    Gravida  3   Para      Term      Preterm      AB  2   Living  0     SAB  1   TAB  1   Ectopic      Multiple      Live Births             Patient Active Problem List   Diagnosis Date Noted  . Trichomonas infection 06/19/2018  . Supervision of high risk pregnancy, antepartum 05/28/2018  . Limited prenatal care in second trimester 05/28/2018  . Severe recurrent major depression without psychotic features (HCC) 01/26/2018  . Polysubstance dependence including opioid type drug without complication, episodic abuse (HCC) 09/01/2015  . MDD (major depressive disorder) 08/31/2015  . Ileus (HCC) 08/15/2015  . Partial small bowel obstruction 07/20/2015  . Anxiety   . Asthma   . Hearing loss in right ear   . Enteritis 07/18/2015  . PTSD (post-traumatic stress disorder) 03/14/2014  . Opioid use with withdrawal (HCC) 03/12/2014  . Polysubstance (including opioids) dependence with physiological dependence (HCC) 03/12/2014    Past Medical History:  Diagnosis Date  . Anxiety   . Asthma   . Hearing loss in right ear    Pt reports hearing loss in right ear  . Polysubstance abuse (HCC)   . PTSD (post-traumatic stress disorder)   . Small bowel obstruction (HCC)   . Vision  abnormalities    Pt wears glasses    Past Surgical History:  Procedure Laterality Date  . COLONOSCOPY WITH PROPOFOL N/A 08/20/2015   Procedure: COLONOSCOPY WITH PROPOFOL;  Surgeon: Graylin ShiverSalem F Ganem, MD;  Location: [facility];  Service: Endoscopy;  Laterality: N/A;  . FRACTURE SURGERY    . HIP SURGERY    . SPLENECTOMY, TOTAL      History reviewed. No pertinent family history.  Social History   Tobacco Use  . Smoking status: Former Smoker    Packs/day: 1.00    Years: 5.00    Pack years: 5.00    Types: Cigarettes  . Smokeless tobacco: Never Used  Substance Use Topics  . Alcohol use: Not Currently    Frequency: Never  . Drug use: Not Currently    Types: Cocaine, Heroin, Hydrocodone, Oxycodone, Marijuana, Benzodiazepines, Other-see comments, Methamphetamines    Comment: heroine, xanax, crack    Allergies: No Known Allergies  Medications Prior to Admission  Medication Sig Dispense Refill Last Dose  . loratadine (CLARITIN) 10 MG tablet Take 1 tablet (10 mg total) by mouth daily. 30 tablet 1 Past Month at Unknown time  . methadone (DOLOPHINE) 10 MG/ML solution Take 35 mg by mouth every 8 (eight) hours.   Past Week at Unknown  time  . Prenatal Vit-Fe Fumarate-FA (PRENATAL COMPLETE) 14-0.4 MG TABS Take 1 tablet by mouth daily. (May buy from over the counter or use your home supply): Vitamin supplementation 60 each 0 Past Week at Unknown time  . gabapentin (NEURONTIN) 300 MG capsule Take 300 mg by mouth at bedtime.   More than a month at Unknown time    Review of Systems  Constitutional: Negative for chills, fatigue and fever.  Respiratory: Negative for shortness of breath.   Gastrointestinal: Negative for abdominal pain, nausea and vomiting.  Genitourinary: Positive for dysuria, vaginal discharge and vaginal pain. Negative for difficulty urinating, flank pain, pelvic pain and vaginal bleeding.  Musculoskeletal: Negative for back pain.  Neurological: Negative for headaches.  All  other systems reviewed and are negative.  Physical Exam   Blood pressure 134/78, pulse 82, temperature 98.4 F (36.9 C), temperature source Oral, resp. rate 12, weight 58.2 kg, last menstrual period 11/25/2017, SpO2 98 %.  Physical Exam  Nursing note and vitals reviewed. Constitutional: She is oriented to person, place, and time. She appears well-developed and well-nourished.  Cardiovascular: Normal rate.  Respiratory: Effort normal.  GI: Soft. She exhibits no distension. There is no abdominal tenderness. There is no rebound and no guarding.  Gravid  Genitourinary:    Vaginal discharge present.     Genitourinary Comments: Patient unable to tolerate SSE or bimanual exam. Swabs collected via patient self-collect with RN assistance. Greenish white discharge visible on labia prior to speculum placement   Musculoskeletal: Normal range of motion.  Neurological: She is alert and oriented to person, place, and time.  Skin: Skin is warm and dry.  Psychiatric: She has a normal mood and affect. Her behavior is normal. Judgment and thought content normal.    MAU Course  Procedures: sterile speculum exam attempted, patient not able to tolerate  --Reactive tracing: baseline 140, moderate variability, positive 10 x 10 accels, no decels --Toco: UI noted, rare contraction not felt by patient  Patient Vitals for the past 24 hrs:  BP Temp Temp src Pulse Resp SpO2 Weight  06/19/18 2024 132/75 - - 95 - - -  06/19/18 1905 134/78 98.4 F (36.9 C) Oral 82 12 98 % -  06/19/18 1854 136/79 97.9 F (36.6 C) - 86 12 99 % -  06/19/18 1842 - - - - - - 58.2 kg    Results for orders placed or performed during the hospital encounter of 06/19/18 (from the past 24 hour(s))  Wet prep, genital     Status: Abnormal   Collection Time: 06/19/18  7:29 PM  Result Value Ref Range   Yeast Wet Prep HPF POC NONE SEEN NONE SEEN   Trich, Wet Prep PRESENT (A) NONE SEEN   Clue Cells Wet Prep HPF POC PRESENT (A) NONE SEEN    WBC, Wet Prep HPF POC MANY (A) NONE SEEN   Sperm NONE SEEN    Meds ordered this encounter  Medications  . metroNIDAZOLE (FLAGYL) tablet 2,000 mg   Assessment and Plan  --23 y.o. G3P0020 at [redacted]w[redacted]d  --Reactive tracing --Trichomonas. Emphasized abstinence for 7 days, continuous condom use to avoid reinfection --Discharge home in stable condition  F/U: [facility] 07/04/2018  Calvert Cantor, CNM 06/19/2018, 8:35 PM

## 2018-06-19 NOTE — Discharge Instructions (Signed)
Trichomoniasis Trichomoniasis is an STI (sexually transmitted infection) that can affect both women and men. In women, the outer area of the female genitalia (vulva) and the vagina are affected. In men, the penis is mainly affected, but the prostate and other reproductive organs can also be involved. This condition can be treated with medicine. It often has no symptoms (is asymptomatic), especially in men. What are the causes? This condition is caused by an organism called Trichomonas vaginalis. Trichomoniasis most often spreads from person to person (is contagious) through sexual contact. What increases the risk? The following factors may make you more likely to develop this condition:  Having unprotected sexual intercourse.  Having sexual intercourse with a partner who has trichomoniasis.  Having multiple sexual partners.  Having had previous trichomoniasis infections or other STIs. What are the signs or symptoms? In women, symptoms of trichomoniasis include:  Abnormal vaginal discharge that is clear, white, gray, or yellow-green and foamy and has an unusual "fishy" odor.  Itching and irritation of the vagina and vulva.  Burning or pain during urination or sexual intercourse.  Genital redness and swelling. In men, symptoms of trichomoniasis include:  Penile discharge that may be foamy or contain pus.  Pain in the penis. This may happen only when urinating.  Itching or irritation inside the penis.  Burning after urination or ejaculation. How is this diagnosed? In women, this condition may be found during a routine Pap test or physical exam. It may be found in men during a routine physical exam. Your health care provider may perform tests to help diagnose this infection, such as:  Urine tests (men and women).  The following in women: ? Testing the pH of the vagina. ? A vaginal swab test that checks for the Trichomonas vaginalis organism. ? Testing vaginal secretions. Your  health care provider may test you for other STIs, including HIV (human immunodeficiency virus). How is this treated? This condition is treated with medicine taken by mouth (orally), such as metronidazole or tinidazole to fight the infection. Your sexual partner(s) may also need to be tested and treated.  If you are a woman and you plan to become pregnant or think you may be pregnant, tell your health care provider right away. Some medicines that are used to treat the infection should not be taken during pregnancy. Your health care provider may recommend over-the-counter medicines or creams to help relieve itching or irritation. You may be tested for infection again 3 months after treatment. Follow these instructions at home:  Take and use over-the-counter and prescription medicines, including creams, only as told by your health care provider.  Do not have sexual intercourse until one week after you finish your medicine, or until your health care provider approves. Ask your health care provider when you may resume sexual intercourse.  (Women) Do not douche or wear tampons while you have the infection.  Discuss your infection with your sexual partner(s). Make sure that your partner gets tested and treated, if necessary.  Keep all follow-up visits as told by your health care provider. This is important. How is this prevented?  Use condoms every time you have sex. Using condoms correctly and consistently can help protect against STIs.  Avoid having multiple sexual partners.  Talk with your sexual partner about any symptoms that either of you may have, as well as any history of STIs.  Get tested for STIs and STDs (sexually transmitted diseases) before you have sex. Ask your partner to do the same.    Do not have sexual contact if you have symptoms of trichomoniasis or another STI. Contact a health care provider if:  You still have symptoms after you finish your medicine.  You develop pain in  your abdomen.  You have pain when you urinate.  You have bleeding after sexual intercourse.  You develop a rash.  You feel nauseous or you vomit.  You plan to become pregnant or think you may be pregnant. Summary  Trichomoniasis is an STI (sexually transmitted infection) that can affect both women and men.  This condition often has no symptoms (is asymptomatic), especially in men.  You should not have sexual intercourse until one week after you finish your medicine, or until your health care provider approves. Ask your health care provider when you may resume sexual intercourse.  Discuss your infection with your sexual partner. Make sure that your partner gets tested and treated, if necessary. This information is not intended to replace advice given to you by your health care provider. Make sure you discuss any questions you have with your health care provider. Document Released: 08/10/2000 Document Revised: 01/08/2016 Document Reviewed: 01/08/2016 Elsevier Interactive Patient Education  2019 Elsevier Inc.  

## 2018-06-19 NOTE — MAU Note (Signed)
Pt states that she is having bad vaginal discharge.   Pt reports discharge started a couple of days ago describes it as brown and foul smelling.   Pt reports rash over vagina and in vagina.  Pt states that the rash is irritating.   Reports +FM  Denies vaginal bleeding or LOF

## 2018-06-20 LAB — GC/CHLAMYDIA PROBE AMP (~~LOC~~) NOT AT ARMC
Chlamydia: NEGATIVE
Neisseria Gonorrhea: NEGATIVE

## 2018-06-20 NOTE — Telephone Encounter (Signed)
Per chart review, pt was seen for evaluation @ MAU yesterday. She received treatment for +trichomonas. No additional follow up indicated @ this time. Pt has next office visit on 5/6.

## 2018-06-21 ENCOUNTER — Encounter: Payer: Self-pay | Admitting: Student

## 2018-06-21 DIAGNOSIS — R825 Elevated urine levels of drugs, medicaments and biological substances: Secondary | ICD-10-CM

## 2018-06-28 ENCOUNTER — Telehealth: Payer: Self-pay

## 2018-06-28 NOTE — Telephone Encounter (Signed)
Called to verify if patient has BP cuff. Patient does but does not have a device to access babyRx. Encouraged patient to check BP weekly and instructed on proper use of cuff. Warning signs reviewed with patient.  Rolm Bookbinder, CNM 06/28/18 11:07 AM

## 2018-07-03 ENCOUNTER — Telehealth: Payer: Self-pay | Admitting: Advanced Practice Midwife

## 2018-07-03 NOTE — Telephone Encounter (Signed)
Called to the patient to inform of the face to face visit. Her grandmother stated she would give her the information as she is the one bringing her. Also advised of no visitors due to the COVID19 restrictions.

## 2018-07-04 ENCOUNTER — Encounter: Payer: Self-pay | Admitting: Obstetrics and Gynecology

## 2018-07-04 ENCOUNTER — Ambulatory Visit (INDEPENDENT_AMBULATORY_CARE_PROVIDER_SITE_OTHER): Payer: Medicaid Other | Admitting: Obstetrics and Gynecology

## 2018-07-04 ENCOUNTER — Other Ambulatory Visit: Payer: Self-pay

## 2018-07-04 ENCOUNTER — Other Ambulatory Visit: Payer: Medicaid Other

## 2018-07-04 ENCOUNTER — Ambulatory Visit (INDEPENDENT_AMBULATORY_CARE_PROVIDER_SITE_OTHER): Payer: Medicaid Other | Admitting: Clinical

## 2018-07-04 VITALS — BP 133/82 | HR 97 | Temp 97.6°F | Wt 129.4 lb

## 2018-07-04 DIAGNOSIS — O0993 Supervision of high risk pregnancy, unspecified, third trimester: Secondary | ICD-10-CM | POA: Diagnosis present

## 2018-07-04 DIAGNOSIS — Z87898 Personal history of other specified conditions: Secondary | ICD-10-CM | POA: Diagnosis not present

## 2018-07-04 DIAGNOSIS — O0933 Supervision of pregnancy with insufficient antenatal care, third trimester: Secondary | ICD-10-CM

## 2018-07-04 DIAGNOSIS — O0932 Supervision of pregnancy with insufficient antenatal care, second trimester: Secondary | ICD-10-CM

## 2018-07-04 DIAGNOSIS — S329XXA Fracture of unspecified parts of lumbosacral spine and pelvis, initial encounter for closed fracture: Secondary | ICD-10-CM | POA: Insufficient documentation

## 2018-07-04 DIAGNOSIS — Z23 Encounter for immunization: Secondary | ICD-10-CM

## 2018-07-04 DIAGNOSIS — F4323 Adjustment disorder with mixed anxiety and depressed mood: Secondary | ICD-10-CM | POA: Diagnosis present

## 2018-07-04 DIAGNOSIS — A599 Trichomoniasis, unspecified: Secondary | ICD-10-CM | POA: Diagnosis not present

## 2018-07-04 DIAGNOSIS — R825 Elevated urine levels of drugs, medicaments and biological substances: Secondary | ICD-10-CM | POA: Diagnosis not present

## 2018-07-04 DIAGNOSIS — O0990 Supervision of high risk pregnancy, unspecified, unspecified trimester: Secondary | ICD-10-CM

## 2018-07-04 MED ORDER — METRONIDAZOLE 500 MG PO TABS: ORAL_TABLET | ORAL | 0 refills | Status: DC

## 2018-07-04 MED ORDER — DOXYLAMINE-PYRIDOXINE 10-10 MG PO TBEC: 2.0000 | DELAYED_RELEASE_TABLET | Freq: Every day | ORAL | 5 refills | Status: DC

## 2018-07-04 NOTE — BH Specialist Note (Signed)
[facility] pt able to get on Webex via phone call only, did not put on app, so unable to use video. Pt also had technical difficulties with her home internet connection and phone with low charge. Pt is very interested in using video visits in the future, and agrees to download the app and have phone fully charged at her next visit.   Integrated Behavioral Health Follow Up Visit  MRN: [id number] Name: Deborah Boone  Number of Integrated Behavioral Health Clinician visits: 2/6 Session Start time: 2:10  Session End time: 2:35 Total time: 20 minutes  Type of Service: Integrated Behavioral Health- Individual/Family Interpretor:No. Interpretor Name and Language: n/a  SUBJECTIVE: Deborah KenningShannon B Strand is a 23 y.o. female accompanied by n/a Patient was referred by Leroy LibmanKelly Davis, MD for positive depression screen. Patient reports the following symptoms/concerns: Pt states her primary symptom today is irritability; also experiencing worry, anxiety, lack of concentration, lack of appetite due to nausea, and oversleeping, along with mild depression. Pt copes best by walking daily and doing yard work; prefers to wait until after baby is born to start back on Zoloft.  Duration of problem: Increase in pregnancy; Severity of problem: moderate  OBJECTIVE: Mood: Anxious, Depressed and Irritable and Affect: Appropriate Risk of harm to self or others: No plan to harm self or others  LIFE CONTEXT: Family and Social: Pt lives with her grandmother and fiancee, with parents close by; family are supportive School/Work: - Self-Care: Substance use in pregnancy Life Changes: Current pregnancy  GOALS ADDRESSED: Patient will: 1.  Reduce symptoms of: anxiety, depression and stress  2.  Increase knowledge and/or ability of: healthy habits and stress reduction  3.  Demonstrate ability to: Increase healthy adjustment to current life circumstances, Increase motivation to adhere to plan of care and  Decrease self-medicating behaviors  INTERVENTIONS: Interventions utilized:  Functional Assessment of ADLs and Psychoeducation and/or Health Education Standardized Assessments completed: GAD-7 and PHQ 9  ASSESSMENT: Patient currently experiencing Adjustment disorder with mixed anxious and depressed mood and History of substance use. (Further assessment at next visit, due to technical difficulties today)  Patient may benefit from psychoeducation and brief therapeutic interventions regarding coping with symptoms of anxiety and depression, and further assessment of substance use disorder at next visit.  PLAN: 1. Follow up with behavioral health clinician on : One week  2. Behavioral recommendations:  - Download Webex app onto either phone and/or laptop with camera prior to next appointment -Continue taking prenatal vitamin daily, as recommended by medical provider -When phone is fully charged today, search online for foods high in iron and foods high in vitamin C. Consider pairing foods you like from each list, at least once/day.  -Continue taking at least one walk outside and/or yard work with family daily 3. Referral(s): Integrated Behavioral Health Services (In Clinic) 4. "From scale of 1-10, how likely are you to follow plan?": -  Rae LipsJamie C Stayce Delancy, LCSW  Dep[facility] screen [facility] 07/04/2018  Decreased Interest 1  Down, Depressed, Hopeless 1  PHQ - 2 Score 2  Altered sleeping 2  Tired, decreased energy 1  Change in appetite 2  Feeling bad or failure about yourself  1  Trouble concentrating 2  Moving slowly or fidgety/restless 1  Suicidal thoughts 0  PHQ-9 Score 11   GAD 7 : Generalized Anxiety Score 07/04/2018  Nervous, Anxious, on Edge 1  Control/stop worrying 0  Worry too much - different things 3  Trouble relaxing 0  Restless  0  Easily annoyed or irritable 3  Afraid - awful might happen 0  Total GAD 7 Score 7

## 2018-07-04 NOTE — Progress Notes (Signed)
Anatomy ultrasound scheduled for Monday 5/11 @ 1400
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
## 2018-07-05 LAB — GLUCOSE TOLERANCE, 2 HOURS W/ 1HR
Glucose, 1 hour: 129 mg/dL (ref 65–179)
Glucose, 2 hour: 82 mg/dL (ref 65–152)
Glucose, Fasting: 88 mg/dL (ref 65–91)

## 2018-07-09 ENCOUNTER — Other Ambulatory Visit: Payer: Self-pay

## 2018-07-09 ENCOUNTER — Encounter (HOSPITAL_COMMUNITY): Payer: Self-pay | Admitting: *Deleted

## 2018-07-09 ENCOUNTER — Ambulatory Visit (HOSPITAL_COMMUNITY)
Admission: RE | Admit: 2018-07-09 | Discharge: 2018-07-09 | Disposition: A | Discharge status: Home / Self Care | Payer: Medicaid Other | Source: Ambulatory Visit | Attending: Obstetrics and Gynecology | Admitting: Obstetrics and Gynecology

## 2018-07-09 ENCOUNTER — Ambulatory Visit (HOSPITAL_COMMUNITY): Payer: Medicaid Other | Admitting: *Deleted

## 2018-07-09 DIAGNOSIS — F1910 Other psychoactive substance abuse, uncomplicated: Secondary | ICD-10-CM

## 2018-07-09 DIAGNOSIS — O0990 Supervision of high risk pregnancy, unspecified, unspecified trimester: Secondary | ICD-10-CM | POA: Insufficient documentation

## 2018-07-09 DIAGNOSIS — J45909 Unspecified asthma, uncomplicated: Secondary | ICD-10-CM | POA: Diagnosis not present

## 2018-07-09 DIAGNOSIS — O9989 Other specified diseases and conditions complicating pregnancy, childbirth and the puerperium: Secondary | ICD-10-CM | POA: Diagnosis not present

## 2018-07-09 DIAGNOSIS — O0932 Supervision of pregnancy with insufficient antenatal care, second trimester: Secondary | ICD-10-CM

## 2018-07-09 DIAGNOSIS — Z3A32 32 weeks gestation of pregnancy: Secondary | ICD-10-CM

## 2018-07-09 DIAGNOSIS — O99323 Drug use complicating pregnancy, third trimester: Secondary | ICD-10-CM

## 2018-07-09 DIAGNOSIS — Z363 Encounter for antenatal screening for malformations: Secondary | ICD-10-CM

## 2018-07-09 DIAGNOSIS — F1410 Cocaine abuse, uncomplicated: Secondary | ICD-10-CM | POA: Diagnosis not present

## 2018-07-10 ENCOUNTER — Other Ambulatory Visit (HOSPITAL_COMMUNITY): Payer: Self-pay | Admitting: *Deleted

## 2018-07-10 DIAGNOSIS — O99323 Drug use complicating pregnancy, third trimester: Secondary | ICD-10-CM

## 2018-07-10 DIAGNOSIS — F1120 Opioid dependence, uncomplicated: Secondary | ICD-10-CM

## 2018-07-11 ENCOUNTER — Ambulatory Visit: Payer: Medicaid Other | Admitting: Clinical

## 2018-07-11 ENCOUNTER — Other Ambulatory Visit: Payer: Self-pay

## 2018-07-11 NOTE — BH Specialist Note (Signed)
Patient did not arrive to [facility] video appointment, and did not answer phone; Left HIPPA-compliant message to call back Asher Muir from Center for [facility] at [phone] within the next 20 minutes, or [phone] to reschedule.   Integrated Behavioral Health Follow Up Visit  MRN: [id number] Name: Deborah Boone

## 2018-07-18 ENCOUNTER — Encounter: Payer: Self-pay | Admitting: Obstetrics and Gynecology

## 2018-07-18 ENCOUNTER — Other Ambulatory Visit: Payer: Self-pay

## 2018-07-18 ENCOUNTER — Telehealth (INDEPENDENT_AMBULATORY_CARE_PROVIDER_SITE_OTHER): Payer: Medicaid Other | Admitting: Obstetrics and Gynecology

## 2018-07-18 ENCOUNTER — Telehealth: Payer: Self-pay | Admitting: Family Medicine

## 2018-07-18 DIAGNOSIS — O0990 Supervision of high risk pregnancy, unspecified, unspecified trimester: Secondary | ICD-10-CM

## 2018-07-18 DIAGNOSIS — O0933 Supervision of pregnancy with insufficient antenatal care, third trimester: Secondary | ICD-10-CM

## 2018-07-18 DIAGNOSIS — A599 Trichomoniasis, unspecified: Secondary | ICD-10-CM

## 2018-07-18 DIAGNOSIS — R825 Elevated urine levels of drugs, medicaments and biological substances: Secondary | ICD-10-CM | POA: Diagnosis not present

## 2018-07-18 DIAGNOSIS — O0932 Supervision of pregnancy with insufficient antenatal care, second trimester: Secondary | ICD-10-CM

## 2018-07-18 DIAGNOSIS — Z3A33 33 weeks gestation of pregnancy: Secondary | ICD-10-CM

## 2018-07-18 DIAGNOSIS — Z8719 Personal history of other diseases of the digestive system: Secondary | ICD-10-CM

## 2018-07-18 DIAGNOSIS — Z87820 Personal history of traumatic brain injury: Secondary | ICD-10-CM | POA: Insufficient documentation

## 2018-07-18 HISTORY — DX: Personal history of other diseases of the digestive system: Z87.19

## 2018-07-18 NOTE — Telephone Encounter (Signed)
Patient downloaded the Mychart app
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
## 2018-07-18 NOTE — Progress Notes (Signed)
Called Hoy Finlay to request NICU tour, spoke with her directly & gave her pts info. She states visit may be virtual as well.

## 2018-07-24 NOTE — Progress Notes (Signed)
NAS consulted attempted via telephone.  Unable to reach mother.  Will attempt again next week.

## 2018-07-31 ENCOUNTER — Ambulatory Visit (HOSPITAL_COMMUNITY)
Admission: RE | Admit: 2018-07-31 | Discharge: 2018-07-31 | Disposition: A | Discharge status: Home / Self Care | Payer: Medicaid Other | Source: Ambulatory Visit | Attending: Obstetrics and Gynecology | Admitting: Obstetrics and Gynecology

## 2018-07-31 ENCOUNTER — Encounter (HOSPITAL_COMMUNITY): Payer: Self-pay

## 2018-07-31 ENCOUNTER — Ambulatory Visit (HOSPITAL_COMMUNITY): Payer: Medicaid Other | Admitting: *Deleted

## 2018-07-31 ENCOUNTER — Other Ambulatory Visit (HOSPITAL_COMMUNITY): Payer: Self-pay | Admitting: Obstetrics and Gynecology

## 2018-07-31 ENCOUNTER — Other Ambulatory Visit: Payer: Self-pay

## 2018-07-31 VITALS — BP 129/79 | HR 83 | Temp 98.1°F

## 2018-07-31 DIAGNOSIS — F1120 Opioid dependence, uncomplicated: Secondary | ICD-10-CM

## 2018-07-31 DIAGNOSIS — O99323 Drug use complicating pregnancy, third trimester: Secondary | ICD-10-CM

## 2018-07-31 DIAGNOSIS — O0932 Supervision of pregnancy with insufficient antenatal care, second trimester: Secondary | ICD-10-CM | POA: Insufficient documentation

## 2018-07-31 DIAGNOSIS — O0990 Supervision of high risk pregnancy, unspecified, unspecified trimester: Secondary | ICD-10-CM | POA: Insufficient documentation

## 2018-07-31 DIAGNOSIS — Z362 Encounter for other antenatal screening follow-up: Secondary | ICD-10-CM

## 2018-07-31 DIAGNOSIS — O9989 Other specified diseases and conditions complicating pregnancy, childbirth and the puerperium: Secondary | ICD-10-CM | POA: Diagnosis not present

## 2018-07-31 DIAGNOSIS — O0933 Supervision of pregnancy with insufficient antenatal care, third trimester: Secondary | ICD-10-CM

## 2018-07-31 DIAGNOSIS — O0930 Supervision of pregnancy with insufficient antenatal care, unspecified trimester: Secondary | ICD-10-CM

## 2018-07-31 DIAGNOSIS — J45909 Unspecified asthma, uncomplicated: Secondary | ICD-10-CM

## 2018-07-31 DIAGNOSIS — O365990 Maternal care for other known or suspected poor fetal growth, unspecified trimester, not applicable or unspecified: Secondary | ICD-10-CM | POA: Diagnosis present

## 2018-07-31 DIAGNOSIS — F1910 Other psychoactive substance abuse, uncomplicated: Secondary | ICD-10-CM

## 2018-07-31 DIAGNOSIS — Z3A35 35 weeks gestation of pregnancy: Secondary | ICD-10-CM

## 2018-07-31 DIAGNOSIS — F1410 Cocaine abuse, uncomplicated: Secondary | ICD-10-CM

## 2018-07-31 DIAGNOSIS — O365930 Maternal care for other known or suspected poor fetal growth, third trimester, not applicable or unspecified: Secondary | ICD-10-CM

## 2018-07-31 NOTE — Procedures (Signed)
Deborah Boone October 12, 1995 [redacted]w[redacted]d  [name] Non-Stress Test Interpretation for 07/31/18  Indication: growth restriction  Fetal Heart Rate A Mode: External Baseline Rate (A): 120 bpm Variability: Minimal, Moderate Accelerations: 15 x 15 Decelerations: None Multiple birth?: No  Uterine Activity Mode: Palpation, Toco Contraction Frequency (min): irregular with U/I Contraction Duration (sec): 30-60 Contraction Quality: Mild(Pt denies feeling) Resting Tone Palpated: Relaxed Resting Time: Adequate  Interpretation (Fetal Testing) Nonstress Test Interpretation: Reactive Comments: Reviewed tracing with Dr. Judeth Cornfield

## 2018-08-01 ENCOUNTER — Other Ambulatory Visit (HOSPITAL_COMMUNITY): Payer: Self-pay | Admitting: *Deleted

## 2018-08-01 DIAGNOSIS — O365930 Maternal care for other known or suspected poor fetal growth, third trimester, not applicable or unspecified: Secondary | ICD-10-CM

## 2018-08-07 ENCOUNTER — Encounter (HOSPITAL_COMMUNITY): Payer: Self-pay | Admitting: *Deleted

## 2018-08-07 ENCOUNTER — Ambulatory Visit (HOSPITAL_COMMUNITY): Payer: Medicaid Other | Admitting: *Deleted

## 2018-08-07 ENCOUNTER — Ambulatory Visit (HOSPITAL_COMMUNITY)
Admission: RE | Admit: 2018-08-07 | Discharge: 2018-08-07 | Disposition: A | Discharge status: Home / Self Care | Payer: Medicaid Other | Source: Ambulatory Visit | Attending: Obstetrics and Gynecology | Admitting: Obstetrics and Gynecology

## 2018-08-07 ENCOUNTER — Telehealth: Payer: Self-pay | Admitting: Family Medicine

## 2018-08-07 ENCOUNTER — Other Ambulatory Visit: Payer: Self-pay

## 2018-08-07 DIAGNOSIS — J45909 Unspecified asthma, uncomplicated: Secondary | ICD-10-CM | POA: Diagnosis not present

## 2018-08-07 DIAGNOSIS — O0932 Supervision of pregnancy with insufficient antenatal care, second trimester: Secondary | ICD-10-CM | POA: Insufficient documentation

## 2018-08-07 DIAGNOSIS — F1410 Cocaine abuse, uncomplicated: Secondary | ICD-10-CM

## 2018-08-07 DIAGNOSIS — O365930 Maternal care for other known or suspected poor fetal growth, third trimester, not applicable or unspecified: Secondary | ICD-10-CM

## 2018-08-07 DIAGNOSIS — O99323 Drug use complicating pregnancy, third trimester: Secondary | ICD-10-CM | POA: Diagnosis not present

## 2018-08-07 DIAGNOSIS — O0990 Supervision of high risk pregnancy, unspecified, unspecified trimester: Secondary | ICD-10-CM

## 2018-08-07 DIAGNOSIS — O9989 Other specified diseases and conditions complicating pregnancy, childbirth and the puerperium: Secondary | ICD-10-CM | POA: Diagnosis not present

## 2018-08-07 DIAGNOSIS — O0933 Supervision of pregnancy with insufficient antenatal care, third trimester: Secondary | ICD-10-CM

## 2018-08-07 DIAGNOSIS — F1910 Other psychoactive substance abuse, uncomplicated: Secondary | ICD-10-CM

## 2018-08-07 DIAGNOSIS — Z3A36 36 weeks gestation of pregnancy: Secondary | ICD-10-CM

## 2018-08-07 NOTE — Telephone Encounter (Signed)
Patient have mychart app downloaded

## 2018-08-07 NOTE — Procedures (Signed)
Deborah Boone March 05, 1995 [redacted]w[redacted]d  [name] Non-Stress Test Interpretation for 08/07/18  Indication: IUGR  Fetal Heart Rate A Mode: External Baseline Rate (A): 120 bpm Variability: Moderate Accelerations: 15 x 15 Decelerations: None Multiple birth?: No  Uterine Activity Mode: Toco Contraction Frequency (min): none noted  Interpretation (Fetal Testing) Nonstress Test Interpretation: Reactive Comments: FHR tracing rev'd by Dr. Donalee Citrin

## 2018-08-09 ENCOUNTER — Telehealth: Payer: Self-pay | Admitting: Family Medicine

## 2018-08-09 NOTE — Telephone Encounter (Signed)
Called and spoke to patient, she was instructed to wear a face mask, and also due to COVID no visitors are allowed at this time. Patient state she have no symptoms was told if she developed any over the weekend to call our office, patient understood

## 2018-08-13 ENCOUNTER — Ambulatory Visit (INDEPENDENT_AMBULATORY_CARE_PROVIDER_SITE_OTHER): Payer: Medicaid Other | Admitting: Obstetrics and Gynecology

## 2018-08-13 ENCOUNTER — Encounter: Payer: Self-pay | Admitting: Obstetrics and Gynecology

## 2018-08-13 ENCOUNTER — Other Ambulatory Visit: Payer: Self-pay

## 2018-08-13 ENCOUNTER — Other Ambulatory Visit (HOSPITAL_COMMUNITY)
Admission: RE | Admit: 2018-08-13 | Discharge: 2018-08-13 | Disposition: A | Discharge status: Home / Self Care | Payer: Medicaid Other | Source: Ambulatory Visit | Attending: Obstetrics and Gynecology | Admitting: Obstetrics and Gynecology

## 2018-08-13 VITALS — BP 119/80 | HR 109 | Temp 98.0°F | Wt 129.3 lb

## 2018-08-13 DIAGNOSIS — O0990 Supervision of high risk pregnancy, unspecified, unspecified trimester: Secondary | ICD-10-CM | POA: Diagnosis present

## 2018-08-13 DIAGNOSIS — Z3A37 37 weeks gestation of pregnancy: Secondary | ICD-10-CM

## 2018-08-13 DIAGNOSIS — O365990 Maternal care for other known or suspected poor fetal growth, unspecified trimester, not applicable or unspecified: Secondary | ICD-10-CM | POA: Insufficient documentation

## 2018-08-13 DIAGNOSIS — O365930 Maternal care for other known or suspected poor fetal growth, third trimester, not applicable or unspecified: Secondary | ICD-10-CM

## 2018-08-13 DIAGNOSIS — A599 Trichomoniasis, unspecified: Secondary | ICD-10-CM

## 2018-08-13 DIAGNOSIS — O9A213 Injury, poisoning and certain other consequences of external causes complicating pregnancy, third trimester: Secondary | ICD-10-CM

## 2018-08-13 DIAGNOSIS — O98811 Other maternal infectious and parasitic diseases complicating pregnancy, first trimester: Secondary | ICD-10-CM

## 2018-08-13 DIAGNOSIS — F1920 Other psychoactive substance dependence, uncomplicated: Secondary | ICD-10-CM

## 2018-08-13 DIAGNOSIS — S32509D Unspecified fracture of unspecified pubis, subsequent encounter for fracture with routine healing: Secondary | ICD-10-CM

## 2018-08-13 NOTE — Patient Instructions (Signed)
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
## 2018-08-14 ENCOUNTER — Encounter (HOSPITAL_COMMUNITY): Payer: Self-pay

## 2018-08-14 ENCOUNTER — Ambulatory Visit (HOSPITAL_BASED_OUTPATIENT_CLINIC_OR_DEPARTMENT_OTHER): Payer: Medicaid Other | Admitting: *Deleted

## 2018-08-14 ENCOUNTER — Ambulatory Visit (HOSPITAL_COMMUNITY)
Admission: RE | Admit: 2018-08-14 | Discharge: 2018-08-14 | Disposition: A | Discharge status: Home / Self Care | Payer: Medicaid Other | Source: Ambulatory Visit | Attending: Obstetrics and Gynecology | Admitting: Obstetrics and Gynecology

## 2018-08-14 ENCOUNTER — Inpatient Hospital Stay (HOSPITAL_COMMUNITY)
Admission: AD | Admit: 2018-08-14 | Discharge: 2018-08-14 | Disposition: A | Discharge status: Home / Self Care | Payer: Medicaid Other | Attending: Family Medicine | Admitting: Family Medicine

## 2018-08-14 ENCOUNTER — Ambulatory Visit (HOSPITAL_COMMUNITY): Payer: Medicaid Other | Admitting: *Deleted

## 2018-08-14 ENCOUNTER — Encounter (HOSPITAL_COMMUNITY): Payer: Self-pay | Admitting: *Deleted

## 2018-08-14 ENCOUNTER — Other Ambulatory Visit: Payer: Self-pay

## 2018-08-14 DIAGNOSIS — J45909 Unspecified asthma, uncomplicated: Secondary | ICD-10-CM

## 2018-08-14 DIAGNOSIS — O365930 Maternal care for other known or suspected poor fetal growth, third trimester, not applicable or unspecified: Secondary | ICD-10-CM | POA: Diagnosis not present

## 2018-08-14 DIAGNOSIS — O368330 Maternal care for abnormalities of the fetal heart rate or rhythm, third trimester, not applicable or unspecified: Secondary | ICD-10-CM | POA: Diagnosis not present

## 2018-08-14 DIAGNOSIS — F1910 Other psychoactive substance abuse, uncomplicated: Secondary | ICD-10-CM | POA: Diagnosis not present

## 2018-08-14 DIAGNOSIS — Z3A37 37 weeks gestation of pregnancy: Secondary | ICD-10-CM | POA: Insufficient documentation

## 2018-08-14 DIAGNOSIS — O9989 Other specified diseases and conditions complicating pregnancy, childbirth and the puerperium: Secondary | ICD-10-CM

## 2018-08-14 DIAGNOSIS — O0990 Supervision of high risk pregnancy, unspecified, unspecified trimester: Secondary | ICD-10-CM | POA: Diagnosis present

## 2018-08-14 DIAGNOSIS — Z87891 Personal history of nicotine dependence: Secondary | ICD-10-CM | POA: Diagnosis not present

## 2018-08-14 DIAGNOSIS — O99323 Drug use complicating pregnancy, third trimester: Secondary | ICD-10-CM

## 2018-08-14 DIAGNOSIS — O368390 Maternal care for abnormalities of the fetal heart rate or rhythm, unspecified trimester, not applicable or unspecified: Secondary | ICD-10-CM

## 2018-08-14 DIAGNOSIS — F1120 Opioid dependence, uncomplicated: Secondary | ICD-10-CM | POA: Diagnosis present

## 2018-08-14 DIAGNOSIS — F1410 Cocaine abuse, uncomplicated: Secondary | ICD-10-CM

## 2018-08-14 DIAGNOSIS — O0933 Supervision of pregnancy with insufficient antenatal care, third trimester: Secondary | ICD-10-CM

## 2018-08-14 DIAGNOSIS — O365931 Maternal care for other known or suspected poor fetal growth, third trimester, fetus 1: Secondary | ICD-10-CM | POA: Diagnosis not present

## 2018-08-14 DIAGNOSIS — O0932 Supervision of pregnancy with insufficient antenatal care, second trimester: Secondary | ICD-10-CM | POA: Diagnosis present

## 2018-08-14 DIAGNOSIS — Z8781 Personal history of (healed) traumatic fracture: Secondary | ICD-10-CM

## 2018-08-14 DIAGNOSIS — S32509D Unspecified fracture of unspecified pubis, subsequent encounter for fracture with routine healing: Secondary | ICD-10-CM | POA: Diagnosis present

## 2018-08-14 DIAGNOSIS — F1190 Opioid use, unspecified, uncomplicated: Secondary | ICD-10-CM

## 2018-08-14 MED ORDER — LACTATED RINGERS IV BOLUS
1000.0000 mL | Freq: Once | INTRAVENOUS | Status: AC
  Administered 2018-08-14: 19:00:00 1000 mL via INTRAVENOUS

## 2018-08-14 NOTE — Discharge Instructions (Signed)
Intrauterine Growth Restriction ° °Intrauterine growth restriction (IUGR) is when a baby is not growing normally during pregnancy. A baby with IUGR is smaller than it should be and may weigh less than normal at birth. °IUGR can result from a problem with the organ that supplies the unborn baby (fetus) with oxygen and nutrition (placenta). Usually, there is no way to prevent this type of problem. Babies with IUGR are at higher risk for early delivery and needing special (intensive) care after birth. °What are the causes? °The most common cause of IUGR is a problem with the placenta or umbilical cord that causes the fetus to get less oxygen or nutrition than needed. Other causes include: °· The mother eating a very unhealthy diet (poor maternal nutrition). °· Exposure to chemicals found in substances such as cigarettes, alcohol, and some drugs. °· Some prescription medicines. °· Other problems that develop in the womb (congenital birth defects). °· Genetic disorders. °· Infection. °· Carrying more than one baby. °What increases the risk? °This condition is more likely to affect babies of mothers who: °· Are over the age of 35 at the time of delivery. °· Are younger than age 16 at the time of delivery. °· Have medical conditions such as high blood pressure, preeclampsia, diabetes, heart or kidney disease, systemic lupus erythematosus, or anemia. °· Live at a very high altitude during pregnancy. °· Have a personal history or family history of: °? IUGR. °? A genetic disorder. °· Take medicines during pregnancy that are related to congenital disabilities. °· Come into contact with infected cat feces (toxoplasmosis). °· Come into contact with chickenpox (varicella) or German measles (rubella). °· Have or are at risk of getting an infectious disease such as syphilis, HIV, or herpes. °· Eat an unhealthy diet during pregnancy. °· Weigh less than 100 pounds. °· Have had treatments to help her have children (infertility  treatments). °· Use tobacco, drugs, or alcohol during pregnancy. °What are the signs or symptoms? °IUGR does not cause many symptoms. You might notice that your baby does not move or kick very often. Also, your belly may not be as big as expected for the stage of your pregnancy. °How is this diagnosed? °This condition is diagnosed with physical exams and prenatal exams. You may also have: °· Fundal measurements to check the size of your uterus. °· An ultrasound done to measure your baby's size compared to the size of other babies at the same stage of development (gestational age). Your health care provider will monitor your baby's growth with ultrasounds throughout pregnancy. °You may also have tests to find the cause of IUGR. These may include: °· Amniocentesis. This is a procedure that involves passing a needle into the uterus to collect a sample of fluid that surrounds the fetus (amniotic fluid). This may be done to check for signs of infection or congenital defects. °· Tests to evaluate blood flow to your baby and placenta. °How is this treated? °In most cases, the goal of treatment is to treat the cause of IUGR. Your health care providers will monitor your pregnancy closely and help you manage your pregnancy. °If your condition is caused by a placenta problem and your baby is not getting enough blood, you may need: °· Medicine to start labor and deliver your baby early (induction). °· Cesarean delivery, also called a C-section. In this procedure, your baby is delivered through an incision in your abdomen and uterus. °Follow these instructions at home: °Medicines °· Take over-the-counter and prescription medicines   only as told by your health care provider. This includes vitamins and supplements.  Make sure that your health care provider knows about and approves of all the medicines, supplements, vitamins, eye drops, and creams that you use. General instructions  Eat a healthy diet that includes fresh fruits  and vegetables, lean proteins, whole grains, and calcium-rich foods such as milk, yogurt, and dark, leafy greens. Work with your health care provider or a dietitian to make sure that: ? You are getting enough nutrients. ? You are gaining enough weight.  Rest as needed. Try to get at least 8 hours of sleep every night.  Do not drink alcohol or use drugs.  Do not use any products that contain nicotine or tobacco, such as cigarettes and e-cigarettes. If you need help quitting, ask your health care provider.  Keep all follow-up visits as told by your health care provider. This is important. Get help right away if you:  Notice that your baby is moving less than usual or is not moving.  Have contractions that are 5 minutes or less apart, or that increase in frequency, intensity, or length.  Have signs and symptoms of infection, including a fever.  Have vaginal bleeding.  Have increased swelling in your legs, hands, or face.  Have vision changes, including seeing spots or having blurry or double vision.  Have a severe headache that does not go away.  Have sudden, sharp abdominal pain or low back pain.  Have an uncontrolled gush or trickle of fluid from your vagina. Summary  Intrauterine growth restriction (IUGR) is when a baby is not growing normally during pregnancy.  The most common cause of IUGR is a problem with the placenta or umbilical cord that causes the fetus to get less oxygen or nutrition than needed.  This condition is diagnosed with physical and prenatal exams. Your health care provider will monitor your baby's growth with ultrasounds throughout pregnancy.  Make sure that your health care provider knows about and approves of all the medicines, supplements, vitamins, eye drops, and creams that you use. This information is not intended to replace advice given to you by your health care provider. Make sure you discuss any questions you have with your health care  provider. Document Released: 11/24/2007 Document Revised: 12/15/2016 Document Reviewed: 12/15/2016 Elsevier Interactive Patient Education  2019 [employer].

## 2018-08-14 NOTE — MAU Note (Signed)
Being followed for IUGR, variables noted, sent in for further eval.  Pt denies pain, bleeding or leaking.

## 2018-08-14 NOTE — Procedures (Signed)
KASSIA DEMARINIS [birth year] [redacted]w[redacted]d  [name] Non-Stress Test Interpretation for 08/14/18  Indication: IUGR  Fetal Heart Rate A Mode: External Baseline Rate (A): 120 bpm Variability: Moderate Accelerations: 15 x 15 Decelerations: Variable Multiple birth?: No  Uterine Activity Mode: Palpation, Toco Contraction Frequency (min): Occas. Contraction Quality: Mild Resting Tone Palpated: Relaxed Resting Time: Adequate  Interpretation (Fetal Testing) Nonstress Test Interpretation: Reactive Overall Impression: Reassuring for gestational age Comments: EFM tracing reviewed by Dr. Gertie Exon
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
## 2018-08-15 LAB — CERVICOVAGINAL ANCILLARY ONLY
Chlamydia: NEGATIVE
Neisseria Gonorrhea: NEGATIVE
Trichomonas: NEGATIVE

## 2018-08-16 ENCOUNTER — Encounter: Payer: Self-pay | Admitting: Advanced Practice Midwife

## 2018-08-17 LAB — CULTURE, BETA STREP (GROUP B ONLY): Strep Gp B Culture: NEGATIVE

## 2018-08-21 ENCOUNTER — Ambulatory Visit (HOSPITAL_COMMUNITY): Payer: Medicaid Other | Admitting: *Deleted

## 2018-08-21 ENCOUNTER — Other Ambulatory Visit: Payer: Self-pay

## 2018-08-21 ENCOUNTER — Ambulatory Visit (HOSPITAL_BASED_OUTPATIENT_CLINIC_OR_DEPARTMENT_OTHER)
Admission: RE | Admit: 2018-08-21 | Discharge: 2018-08-21 | Disposition: A | Discharge status: Home / Self Care | Payer: Medicaid Other | Source: Ambulatory Visit | Attending: Obstetrics and Gynecology | Admitting: Obstetrics and Gynecology

## 2018-08-21 ENCOUNTER — Encounter: Payer: Medicaid Other | Admitting: Advanced Practice Midwife

## 2018-08-21 ENCOUNTER — Encounter: Payer: Self-pay | Admitting: Obstetrics and Gynecology

## 2018-08-21 ENCOUNTER — Encounter (HOSPITAL_COMMUNITY): Payer: Self-pay | Admitting: *Deleted

## 2018-08-21 ENCOUNTER — Ambulatory Visit (HOSPITAL_COMMUNITY): Payer: Medicaid Other

## 2018-08-21 ENCOUNTER — Other Ambulatory Visit: Payer: Self-pay | Admitting: Advanced Practice Midwife

## 2018-08-21 ENCOUNTER — Telehealth: Payer: Self-pay | Admitting: Family Medicine

## 2018-08-21 ENCOUNTER — Encounter (HOSPITAL_COMMUNITY): Payer: Self-pay

## 2018-08-21 ENCOUNTER — Telehealth (HOSPITAL_COMMUNITY): Payer: Self-pay

## 2018-08-21 ENCOUNTER — Inpatient Hospital Stay (HOSPITAL_COMMUNITY)
Admission: AD | Admit: 2018-08-21 | Discharge: 2018-08-25 | DRG: 786 | Disposition: A | Discharge status: Home / Self Care | Payer: Medicaid Other | Attending: Obstetrics and Gynecology | Admitting: Obstetrics and Gynecology

## 2018-08-21 ENCOUNTER — Telehealth: Payer: Self-pay | Admitting: Obstetrics and Gynecology

## 2018-08-21 ENCOUNTER — Ambulatory Visit (INDEPENDENT_AMBULATORY_CARE_PROVIDER_SITE_OTHER): Payer: Medicaid Other | Admitting: Obstetrics and Gynecology

## 2018-08-21 VITALS — BP 134/84 | HR 89 | Temp 98.0°F | Wt 130.7 lb

## 2018-08-21 DIAGNOSIS — Z98891 History of uterine scar from previous surgery: Secondary | ICD-10-CM

## 2018-08-21 DIAGNOSIS — F1410 Cocaine abuse, uncomplicated: Secondary | ICD-10-CM

## 2018-08-21 DIAGNOSIS — J45909 Unspecified asthma, uncomplicated: Secondary | ICD-10-CM

## 2018-08-21 DIAGNOSIS — O9989 Other specified diseases and conditions complicating pregnancy, childbirth and the puerperium: Secondary | ICD-10-CM

## 2018-08-21 DIAGNOSIS — Z1159 Encounter for screening for other viral diseases: Secondary | ICD-10-CM

## 2018-08-21 DIAGNOSIS — Z87820 Personal history of traumatic brain injury: Secondary | ICD-10-CM

## 2018-08-21 DIAGNOSIS — F1910 Other psychoactive substance abuse, uncomplicated: Secondary | ICD-10-CM

## 2018-08-21 DIAGNOSIS — O99323 Drug use complicating pregnancy, third trimester: Secondary | ICD-10-CM | POA: Diagnosis not present

## 2018-08-21 DIAGNOSIS — O365930 Maternal care for other known or suspected poor fetal growth, third trimester, not applicable or unspecified: Secondary | ICD-10-CM

## 2018-08-21 DIAGNOSIS — O0990 Supervision of high risk pregnancy, unspecified, unspecified trimester: Secondary | ICD-10-CM

## 2018-08-21 DIAGNOSIS — O4593 Premature separation of placenta, unspecified, third trimester: Secondary | ICD-10-CM | POA: Diagnosis present

## 2018-08-21 DIAGNOSIS — O99320 Drug use complicating pregnancy, unspecified trimester: Secondary | ICD-10-CM

## 2018-08-21 DIAGNOSIS — F1120 Opioid dependence, uncomplicated: Secondary | ICD-10-CM

## 2018-08-21 DIAGNOSIS — O9902 Anemia complicating childbirth: Secondary | ICD-10-CM | POA: Diagnosis present

## 2018-08-21 DIAGNOSIS — F1920 Other psychoactive substance dependence, uncomplicated: Secondary | ICD-10-CM | POA: Diagnosis present

## 2018-08-21 DIAGNOSIS — A599 Trichomoniasis, unspecified: Secondary | ICD-10-CM

## 2018-08-21 DIAGNOSIS — O0933 Supervision of pregnancy with insufficient antenatal care, third trimester: Secondary | ICD-10-CM

## 2018-08-21 DIAGNOSIS — D649 Anemia, unspecified: Secondary | ICD-10-CM | POA: Diagnosis present

## 2018-08-21 DIAGNOSIS — Z3A38 38 weeks gestation of pregnancy: Secondary | ICD-10-CM

## 2018-08-21 DIAGNOSIS — Z8781 Personal history of (healed) traumatic fracture: Secondary | ICD-10-CM

## 2018-08-21 DIAGNOSIS — Z362 Encounter for other antenatal screening follow-up: Secondary | ICD-10-CM | POA: Diagnosis not present

## 2018-08-21 DIAGNOSIS — F1210 Cannabis abuse, uncomplicated: Secondary | ICD-10-CM | POA: Insufficient documentation

## 2018-08-21 DIAGNOSIS — F332 Major depressive disorder, recurrent severe without psychotic features: Secondary | ICD-10-CM

## 2018-08-21 DIAGNOSIS — O0993 Supervision of high risk pregnancy, unspecified, third trimester: Secondary | ICD-10-CM

## 2018-08-21 DIAGNOSIS — O0932 Supervision of pregnancy with insufficient antenatal care, second trimester: Secondary | ICD-10-CM

## 2018-08-21 DIAGNOSIS — O4103X0 Oligohydramnios, third trimester, not applicable or unspecified: Secondary | ICD-10-CM | POA: Diagnosis present

## 2018-08-21 DIAGNOSIS — Z87891 Personal history of nicotine dependence: Secondary | ICD-10-CM

## 2018-08-21 DIAGNOSIS — O365990 Maternal care for other known or suspected poor fetal growth, unspecified trimester, not applicable or unspecified: Secondary | ICD-10-CM | POA: Diagnosis present

## 2018-08-21 DIAGNOSIS — O165 Unspecified maternal hypertension, complicating the puerperium: Secondary | ICD-10-CM

## 2018-08-21 DIAGNOSIS — O4100X0 Oligohydramnios, unspecified trimester, not applicable or unspecified: Secondary | ICD-10-CM | POA: Diagnosis present

## 2018-08-21 DIAGNOSIS — O99324 Drug use complicating childbirth: Secondary | ICD-10-CM | POA: Diagnosis present

## 2018-08-21 LAB — CBC
HCT: 25.4 % — ABNORMAL LOW (ref 36.0–46.0)
Hemoglobin: 7.7 g/dL — ABNORMAL LOW (ref 12.0–15.0)
MCH: 21.8 pg — ABNORMAL LOW (ref 26.0–34.0)
MCHC: 30.3 g/dL (ref 30.0–36.0)
MCV: 72 fL — ABNORMAL LOW (ref 80.0–100.0)
Platelets: 655 10*3/uL — ABNORMAL HIGH (ref 150–400)
RBC: 3.53 MIL/uL — ABNORMAL LOW (ref 3.87–5.11)
RDW: 17.6 % — ABNORMAL HIGH (ref 11.5–15.5)
WBC: 16.7 10*3/uL — ABNORMAL HIGH (ref 4.0–10.5)
nRBC: 1.4 % — ABNORMAL HIGH (ref 0.0–0.2)

## 2018-08-21 NOTE — Telephone Encounter (Signed)
Patient requesting a call back from nurse. She has questions about her induction date.

## 2018-08-21 NOTE — Progress Notes (Signed)
Prenatal Visit Note Date: 08/21/2018 Clinic: [facility]  Subjective:  Deborah Boone is a 23 y.o. G3P0020 at 4923w3d being seen today for ongoing prenatal care.  She is currently monitored for the following issues for this high-risk pregnancy and has Polysubstance (including opioids) dependence with physiological dependence (HCC); PTSD (post-traumatic stress disorder); Anxiety; Asthma; Hearing loss in right ear; MDD (major depressive disorder); Polysubstance dependence including opioid type drug without complication, episodic abuse (HCC); Severe recurrent major depression without psychotic features (HCC); Supervision of high risk pregnancy, antepartum; Limited prenatal care in second trimester; Trichomonas infection; Pelvis fracture (HCC); History of traumatic brain injury; IUGR (intrauterine growth restriction) affecting care of mother; Neurocognitive deficits; History of vertebral fracture; Cocaine abuse affecting pregnancy (HCC); Marijuana abuse; Methadone maintenance treatment affecting pregnancy (HCC); and History of pelvic fracture on their problem list.  Patient reports no complaints.   Contractions: Irritability. Vag. Bleeding: None.  Movement: Present. Denies leaking of fluid.   The following portions of the patient's history were reviewed and updated as appropriate: allergies, current medications, past family history, past medical history, past social history, past surgical history and problem list. Problem list updated.  Objective:   Vitals:   08/21/18 1131  BP: 134/84  Pulse: 89  Temp: 98 F (36.7 C)  Weight: 130 lb 11.2 oz (59.3 kg)    Fetal Status: Fetal Heart Rate (bpm): 133   Movement: Present     General:  Alert, oriented and cooperative. Patient is in no acute distress.  Skin: Skin is warm and dry. No rash noted.   Cardiovascular: Normal heart rate noted  Respiratory: Normal respiratory effort, no problems with respiration noted  Abdomen: Soft,  gravid, appropriate for gestational age. Pain/Pressure: Present     Pelvic:  Cervical exam deferred        Extremities: Normal range of motion.  Edema: Trace  Mental Status: Normal mood and affect. Normal behavior. Normal judgment and thought content.   Urinalysis:      Assessment and Plan:  Pregnancy: G3P0020 at 1323w3d  1. History of pelvic fracture Seen by OB MD previously and cleared for trial of labor. Follow curve closely  2. Trichomonas infection toc negative last visit  3. Supervision of high risk pregnancy, antepartum D/w her re: BC. Strongly leaning to a [facility] inpatient  4. Severe recurrent major depression without psychotic features (HCC) Needs 10-14d post delivery follow up and close PP f/u  5. Polysubstance dependence including opioid type drug without complication, episodic abuse (HCC) See below UDS on admission  6. Methadone maintenance treatment affecting pregnancy in third trimester (HCC) On 40 qday with [facility]. Will need to confirm dose on day of admission. D/w her re: NAS and likely neonate will have to stay s/p discharge. From prior notes, it appears NICU tour attempted but not successful.   7. Marijuana abuse  8. Limited prenatal care in second trimester SW PP  9. Intrauterine growth restriction (IUGR) affecting care of mother, third trimester, single or unspecified fetus Follow up u/s today. Will set up for 38/6-39/0 IOL  10. History of vertebral fracture Anesthesia consult prior to starting IOL recommended on admission to L&D  11. Cocaine abuse affecting pregnancy in third trimester (HCC) See above  Term labor symptoms and general obstetric precautions including but not limited to vaginal bleeding, contractions, leaking of fluid and fetal movement were reviewed in detail with the patient. Please refer to After Visit Summary for other counseling recommendations.  Return in about 2 weeks (around  09/04/2018).   Aletha Halim, MD

## 2018-08-21 NOTE — Procedures (Signed)
Deborah Boone 1996/02/14 [redacted]w[redacted]d  [name] Non-Stress Test Interpretation for 08/21/18  Indication: IUGR  Fetal Heart Rate A Mode: External Baseline Rate (A): 125 bpm Variability: Moderate Accelerations: 15 x 15 Decelerations: None Multiple birth?: No  Uterine Activity Mode: Palpation Contraction Frequency (min): Occas Contraction Quality: Mild Resting Tone Palpated: Relaxed Resting Time: Adequate  Interpretation (Fetal Testing) Nonstress Test Interpretation: Reactive Comments: EFM tracing reviewed by Dr. Donalee Citrin

## 2018-08-21 NOTE — Telephone Encounter (Signed)
Called patient. She was at home. She appeared to be confused about when she was supposed to come to the hospital for induction. I told her that she was to come to the hospital now for induction. She said that she would be at the hospital shortly.  Truett Mainland, DO 08/21/2018 8:20 PM

## 2018-08-21 NOTE — MAU Note (Signed)
He for induction due to low flds. BS unable to take pt currently

## 2018-08-21 NOTE — Progress Notes (Signed)
Derrill Memo CNM on unit and aware of pt's initial elevated B/P with subsequent normal b/p. No new orders.
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
## 2018-08-21 NOTE — Telephone Encounter (Signed)
Preadmission screen  

## 2018-08-22 ENCOUNTER — Inpatient Hospital Stay (HOSPITAL_COMMUNITY): Payer: Medicaid Other | Admitting: Certified Registered Nurse Anesthetist

## 2018-08-22 ENCOUNTER — Encounter (HOSPITAL_COMMUNITY): Payer: Self-pay

## 2018-08-22 ENCOUNTER — Encounter (HOSPITAL_COMMUNITY)
Admission: AD | Disposition: A | Discharge status: Home / Self Care | Payer: Self-pay | Source: Home / Self Care | Attending: Obstetrics and Gynecology

## 2018-08-22 ENCOUNTER — Inpatient Hospital Stay (HOSPITAL_COMMUNITY)
Admission: RE | Admit: 2018-08-22 | Discharge: 2018-08-22 | Disposition: A | Discharge status: Home / Self Care | Payer: Medicaid Other | Source: Ambulatory Visit

## 2018-08-22 ENCOUNTER — Other Ambulatory Visit: Payer: Self-pay

## 2018-08-22 DIAGNOSIS — Z8781 Personal history of (healed) traumatic fracture: Secondary | ICD-10-CM | POA: Diagnosis not present

## 2018-08-22 DIAGNOSIS — Z98891 History of uterine scar from previous surgery: Secondary | ICD-10-CM

## 2018-08-22 DIAGNOSIS — O99324 Drug use complicating childbirth: Secondary | ICD-10-CM | POA: Diagnosis present

## 2018-08-22 DIAGNOSIS — O4593 Premature separation of placenta, unspecified, third trimester: Secondary | ICD-10-CM | POA: Diagnosis present

## 2018-08-22 DIAGNOSIS — F1410 Cocaine abuse, uncomplicated: Secondary | ICD-10-CM

## 2018-08-22 DIAGNOSIS — O9902 Anemia complicating childbirth: Secondary | ICD-10-CM | POA: Diagnosis present

## 2018-08-22 DIAGNOSIS — O365930 Maternal care for other known or suspected poor fetal growth, third trimester, not applicable or unspecified: Secondary | ICD-10-CM | POA: Diagnosis present

## 2018-08-22 DIAGNOSIS — Z3A38 38 weeks gestation of pregnancy: Secondary | ICD-10-CM

## 2018-08-22 DIAGNOSIS — O365990 Maternal care for other known or suspected poor fetal growth, unspecified trimester, not applicable or unspecified: Secondary | ICD-10-CM | POA: Diagnosis present

## 2018-08-22 DIAGNOSIS — O4103X0 Oligohydramnios, third trimester, not applicable or unspecified: Secondary | ICD-10-CM | POA: Diagnosis present

## 2018-08-22 DIAGNOSIS — Z1159 Encounter for screening for other viral diseases: Secondary | ICD-10-CM | POA: Diagnosis not present

## 2018-08-22 DIAGNOSIS — D649 Anemia, unspecified: Secondary | ICD-10-CM | POA: Diagnosis present

## 2018-08-22 DIAGNOSIS — F1910 Other psychoactive substance abuse, uncomplicated: Secondary | ICD-10-CM | POA: Diagnosis present

## 2018-08-22 DIAGNOSIS — F1920 Other psychoactive substance dependence, uncomplicated: Secondary | ICD-10-CM

## 2018-08-22 DIAGNOSIS — Z87891 Personal history of nicotine dependence: Secondary | ICD-10-CM | POA: Diagnosis not present

## 2018-08-22 DIAGNOSIS — O4100X0 Oligohydramnios, unspecified trimester, not applicable or unspecified: Secondary | ICD-10-CM | POA: Diagnosis present

## 2018-08-22 LAB — CBC
HCT: 37.3 % (ref 36.0–46.0)
Hemoglobin: 11.9 g/dL — ABNORMAL LOW (ref 12.0–15.0)
MCH: 24.6 pg — ABNORMAL LOW (ref 26.0–34.0)
MCHC: 31.9 g/dL (ref 30.0–36.0)
MCV: 77.1 fL — ABNORMAL LOW (ref 80.0–100.0)
Platelets: 666 10*3/uL — ABNORMAL HIGH (ref 150–400)
RBC: 4.84 MIL/uL (ref 3.87–5.11)
RDW: 21.3 % — ABNORMAL HIGH (ref 11.5–15.5)
WBC: 36.5 10*3/uL — ABNORMAL HIGH (ref 4.0–10.5)
nRBC: 0.3 % — ABNORMAL HIGH (ref 0.0–0.2)

## 2018-08-22 LAB — COMPREHENSIVE METABOLIC PANEL
ALT: 22 U/L (ref 0–44)
AST: 22 U/L (ref 15–41)
Albumin: 2.1 g/dL — ABNORMAL LOW (ref 3.5–5.0)
Alkaline Phosphatase: 109 U/L (ref 38–126)
Anion gap: 5 (ref 5–15)
BUN: 13 mg/dL (ref 6–20)
CO2: 22 mmol/L (ref 22–32)
Calcium: 8.9 mg/dL (ref 8.9–10.3)
Chloride: 110 mmol/L (ref 98–111)
Creatinine, Ser: 0.66 mg/dL (ref 0.44–1.00)
GFR calc Af Amer: >60 mL/min (ref >60)
GFR calc non Af Amer: >60 mL/min (ref >60)
Glucose, Bld: 73 mg/dL (ref 70–99)
Potassium: 4.1 mmol/L (ref 3.5–5.1)
Sodium: 137 mmol/L (ref 135–145)
Total Bilirubin: 0.6 mg/dL (ref 0.3–1.2)
Total Protein: 5.8 g/dL — ABNORMAL LOW (ref 6.5–8.1)

## 2018-08-22 LAB — PROTEIN / CREATININE RATIO, URINE
Creatinine, Urine: 110.9 mg/dL
Protein Creatinine Ratio: 0.14 mg/mg{Cre} (ref 0.00–0.15)
Total Protein, Urine: 16 mg/dL

## 2018-08-22 LAB — CREATININE, SERUM
Creatinine, Ser: 0.59 mg/dL (ref 0.44–1.00)
GFR calc Af Amer: >60 mL/min (ref >60)
GFR calc non Af Amer: >60 mL/min (ref >60)

## 2018-08-22 LAB — RAPID URINE DRUG SCREEN, HOSP PERFORMED
Amphetamines: NOT DETECTED
Barbiturates: NOT DETECTED
Benzodiazepines: NOT DETECTED
Cocaine: NOT DETECTED
Opiates: NOT DETECTED
Tetrahydrocannabinol: NOT DETECTED

## 2018-08-22 LAB — ABO/RH: ABO/RH(D): A POS

## 2018-08-22 LAB — RPR: RPR Ser Ql: NONREACTIVE

## 2018-08-22 LAB — PREPARE RBC (CROSSMATCH)

## 2018-08-22 LAB — SARS CORONAVIRUS 2 BY RT PCR (HOSPITAL ORDER, PERFORMED IN ~~LOC~~ HOSPITAL LAB): SARS Coronavirus 2: NEGATIVE

## 2018-08-22 SURGERY — Surgical Case
Anesthesia: General | Wound class: Clean Contaminated

## 2018-08-22 MED ORDER — FENTANYL CITRATE (PF) 250 MCG/5ML IJ SOLN
INTRAMUSCULAR | Status: AC
  Filled 2018-08-22: qty 5

## 2018-08-22 MED ORDER — PROPOFOL 10 MG/ML IV BOLUS
INTRAVENOUS | Status: AC
  Filled 2018-08-22: qty 20

## 2018-08-22 MED ORDER — TETANUS-DIPHTH-ACELL PERTUSSIS 5-2.5-18.5 LF-MCG/0.5 IM SUSP: 0.5000 mL | Freq: Once | INTRAMUSCULAR | Status: DC

## 2018-08-22 MED ORDER — ONDANSETRON HCL 4 MG/2ML IJ SOLN
INTRAMUSCULAR | Status: DC | PRN
  Administered 2018-08-22: 4 mg via INTRAVENOUS

## 2018-08-22 MED ORDER — HYDROMORPHONE HCL 1 MG/ML IJ SOLN
INTRAMUSCULAR | Status: AC
  Filled 2018-08-22: qty 0.5

## 2018-08-22 MED ORDER — CEFAZOLIN SODIUM-DEXTROSE 2-4 GM/100ML-% IV SOLN
INTRAVENOUS | Status: AC
  Filled 2018-08-22: qty 100

## 2018-08-22 MED ORDER — TERBUTALINE SULFATE 1 MG/ML IJ SOLN: 0.2500 mg | Freq: Once | INTRAMUSCULAR | Status: DC | PRN

## 2018-08-22 MED ORDER — SUCCINYLCHOLINE CHLORIDE 200 MG/10ML IV SOSY
PREFILLED_SYRINGE | INTRAVENOUS | Status: AC
  Filled 2018-08-22: qty 10

## 2018-08-22 MED ORDER — SODIUM CHLORIDE 0.9 % IV SOLN
INTRAVENOUS | Status: DC | PRN
  Administered 2018-08-22: 13:00:00 via INTRAVENOUS

## 2018-08-22 MED ORDER — DEXAMETHASONE SODIUM PHOSPHATE 10 MG/ML IJ SOLN
INTRAMUSCULAR | Status: DC | PRN
  Administered 2018-08-22: 4 mg via INTRAVENOUS

## 2018-08-22 MED ORDER — SUCCINYLCHOLINE CHLORIDE 20 MG/ML IJ SOLN
INTRAMUSCULAR | Status: DC | PRN
  Administered 2018-08-22: 160 mg via INTRAVENOUS

## 2018-08-22 MED ORDER — SENNOSIDES-DOCUSATE SODIUM 8.6-50 MG PO TABS
2.0000 | ORAL_TABLET | ORAL | Status: DC
  Administered 2018-08-22 – 2018-08-24 (×3): 2 via ORAL
  Filled 2018-08-22 (×3): qty 2

## 2018-08-22 MED ORDER — MIDAZOLAM HCL 2 MG/2ML IJ SOLN
INTRAMUSCULAR | Status: AC
  Filled 2018-08-22: qty 2

## 2018-08-22 MED ORDER — SIMETHICONE 80 MG PO CHEW
80.0000 mg | CHEWABLE_TABLET | ORAL | Status: DC | PRN
  Administered 2018-08-22: 80 mg via ORAL

## 2018-08-22 MED ORDER — HYDROMORPHONE HCL 1 MG/ML IJ SOLN
INTRAMUSCULAR | Status: AC
  Filled 2018-08-22: qty 1

## 2018-08-22 MED ORDER — SENNA 8.6 MG PO TABS
2.0000 | ORAL_TABLET | Freq: Once | ORAL | Status: AC
  Administered 2018-08-22: 17.2 mg via ORAL
  Filled 2018-08-22: qty 2

## 2018-08-22 MED ORDER — SODIUM CHLORIDE 0.9% IV SOLUTION: Freq: Once | INTRAVENOUS | Status: DC

## 2018-08-22 MED ORDER — ONDANSETRON HCL 4 MG/2ML IJ SOLN
4.0000 mg | Freq: Four times a day (QID) | INTRAMUSCULAR | Status: DC | PRN
  Filled 2018-08-22: qty 2

## 2018-08-22 MED ORDER — OXYTOCIN 40 UNITS IN NORMAL SALINE INFUSION - SIMPLE MED: 2.5000 [IU]/h | INTRAVENOUS | Status: AC

## 2018-08-22 MED ORDER — ZOLPIDEM TARTRATE 5 MG PO TABS: 5.0000 mg | ORAL_TABLET | Freq: Every evening | ORAL | Status: DC | PRN

## 2018-08-22 MED ORDER — PROPOFOL 10 MG/ML IV BOLUS
INTRAVENOUS | Status: DC | PRN
  Administered 2018-08-22: 40 mg via INTRAVENOUS
  Administered 2018-08-22: 10 mg via INTRAVENOUS
  Administered 2018-08-22: 180 mg via INTRAVENOUS
  Administered 2018-08-22 (×2): 40 mg via INTRAVENOUS

## 2018-08-22 MED ORDER — SIMETHICONE 80 MG PO CHEW
80.0000 mg | CHEWABLE_TABLET | Freq: Three times a day (TID) | ORAL | Status: DC
  Administered 2018-08-22 – 2018-08-25 (×8): 80 mg via ORAL
  Filled 2018-08-22 (×8): qty 1

## 2018-08-22 MED ORDER — DIPHENHYDRAMINE HCL 25 MG PO CAPS: 25.0000 mg | ORAL_CAPSULE | Freq: Four times a day (QID) | ORAL | Status: DC | PRN

## 2018-08-22 MED ORDER — METHYLERGONOVINE MALEATE 0.2 MG/ML IJ SOLN
INTRAMUSCULAR | Status: AC
  Filled 2018-08-22: qty 1

## 2018-08-22 MED ORDER — DIBUCAINE (PERIANAL) 1 % EX OINT: 1.0000 "application " | TOPICAL_OINTMENT | CUTANEOUS | Status: DC | PRN

## 2018-08-22 MED ORDER — OXYTOCIN 40 UNITS IN NORMAL SALINE INFUSION - SIMPLE MED: 2.5000 [IU]/h | INTRAVENOUS | Status: DC

## 2018-08-22 MED ORDER — OXYCODONE HCL 5 MG PO TABS
5.0000 mg | ORAL_TABLET | ORAL | Status: DC | PRN
  Administered 2018-08-23 – 2018-08-25 (×7): 10 mg via ORAL
  Filled 2018-08-22 (×8): qty 2

## 2018-08-22 MED ORDER — COCONUT OIL OIL: 1.0000 "application " | TOPICAL_OIL | Status: DC | PRN

## 2018-08-22 MED ORDER — MIDAZOLAM HCL 2 MG/2ML IJ SOLN
INTRAMUSCULAR | Status: DC | PRN
  Administered 2018-08-22: 2 mg via INTRAVENOUS

## 2018-08-22 MED ORDER — LACTATED RINGERS IV SOLN
INTRAVENOUS | Status: DC
  Administered 2018-08-22 – 2018-08-23 (×2): via INTRAVENOUS

## 2018-08-22 MED ORDER — SODIUM CHLORIDE 0.9 % IV SOLN
INTRAVENOUS | Status: DC | PRN
  Administered 2018-08-22: 40 [IU] via INTRAVENOUS

## 2018-08-22 MED ORDER — MISOPROSTOL 25 MCG QUARTER TABLET
25.0000 ug | ORAL_TABLET | ORAL | Status: DC | PRN
  Administered 2018-08-22: 25 ug via VAGINAL
  Filled 2018-08-22: qty 1

## 2018-08-22 MED ORDER — HYDROMORPHONE HCL 1 MG/ML IJ SOLN
0.2500 mg | INTRAMUSCULAR | Status: DC | PRN
  Administered 2018-08-22 (×6): 0.5 mg via INTRAVENOUS

## 2018-08-22 MED ORDER — LACTATED RINGERS IV SOLN
INTRAVENOUS | Status: DC | PRN
  Administered 2018-08-22: 13:00:00 via INTRAVENOUS

## 2018-08-22 MED ORDER — DEXAMETHASONE SODIUM PHOSPHATE 4 MG/ML IJ SOLN
INTRAMUSCULAR | Status: AC
  Filled 2018-08-22: qty 1

## 2018-08-22 MED ORDER — LIDOCAINE HCL (PF) 1 % IJ SOLN: 30.0000 mL | INTRAMUSCULAR | Status: DC | PRN

## 2018-08-22 MED ORDER — ACETAMINOPHEN 10 MG/ML IV SOLN
1000.0000 mg | Freq: Once | INTRAVENOUS | Status: AC
  Administered 2018-08-22: 1000 mg via INTRAVENOUS

## 2018-08-22 MED ORDER — METHADONE HCL 10 MG PO TABS
40.0000 mg | ORAL_TABLET | Freq: Every day | ORAL | Status: DC
  Administered 2018-08-23 – 2018-08-25 (×3): 40 mg via ORAL
  Filled 2018-08-22 (×3): qty 4

## 2018-08-22 MED ORDER — LACTATED RINGERS IV SOLN: 500.0000 mL | INTRAVENOUS | Status: DC | PRN

## 2018-08-22 MED ORDER — PRENATAL MULTIVITAMIN CH
1.0000 | ORAL_TABLET | Freq: Every day | ORAL | Status: DC
  Administered 2018-08-23 – 2018-08-25 (×3): 1 via ORAL
  Filled 2018-08-22 (×3): qty 1

## 2018-08-22 MED ORDER — WITCH HAZEL-GLYCERIN EX PADS: 1.0000 "application " | MEDICATED_PAD | CUTANEOUS | Status: DC | PRN

## 2018-08-22 MED ORDER — ONDANSETRON HCL 4 MG/2ML IJ SOLN: 4.0000 mg | Freq: Once | INTRAMUSCULAR | Status: DC | PRN

## 2018-08-22 MED ORDER — TRANEXAMIC ACID-NACL 1000-0.7 MG/100ML-% IV SOLN
INTRAVENOUS | Status: DC | PRN
  Administered 2018-08-22: 1000 mg via INTRAVENOUS

## 2018-08-22 MED ORDER — METHYLERGONOVINE MALEATE 0.2 MG/ML IJ SOLN
INTRAMUSCULAR | Status: DC | PRN
  Administered 2018-08-22: 0.2 mg via INTRAMUSCULAR

## 2018-08-22 MED ORDER — SOD CITRATE-CITRIC ACID 500-334 MG/5ML PO SOLN
30.0000 mL | ORAL | Status: DC | PRN
  Administered 2018-08-22: 15 mL via ORAL
  Filled 2018-08-22: qty 30

## 2018-08-22 MED ORDER — MEPERIDINE HCL 25 MG/ML IJ SOLN: INTRAMUSCULAR | Status: DC | PRN

## 2018-08-22 MED ORDER — CEFAZOLIN SODIUM-DEXTROSE 2-3 GM-%(50ML) IV SOLR
INTRAVENOUS | Status: DC | PRN
  Administered 2018-08-22: 2 g via INTRAVENOUS

## 2018-08-22 MED ORDER — ACETAMINOPHEN 325 MG PO TABS: 650.0000 mg | ORAL_TABLET | ORAL | Status: DC | PRN

## 2018-08-22 MED ORDER — MENTHOL 3 MG MT LOZG: 1.0000 | LOZENGE | OROMUCOSAL | Status: DC | PRN

## 2018-08-22 MED ORDER — TRANEXAMIC ACID-NACL 1000-0.7 MG/100ML-% IV SOLN
INTRAVENOUS | Status: AC
  Filled 2018-08-22: qty 100

## 2018-08-22 MED ORDER — OXYTOCIN 40 UNITS IN NORMAL SALINE INFUSION - SIMPLE MED
INTRAVENOUS | Status: AC
  Filled 2018-08-22: qty 1000

## 2018-08-22 MED ORDER — ENOXAPARIN SODIUM 40 MG/0.4ML ~~LOC~~ SOLN
40.0000 mg | SUBCUTANEOUS | Status: DC
  Filled 2018-08-22 (×3): qty 0.4

## 2018-08-22 MED ORDER — GABAPENTIN 100 MG PO CAPS
300.0000 mg | ORAL_CAPSULE | Freq: Every day | ORAL | Status: DC
  Administered 2018-08-22: 300 mg via ORAL
  Filled 2018-08-22: qty 3

## 2018-08-22 MED ORDER — OXYTOCIN BOLUS FROM INFUSION: 500.0000 mL | Freq: Once | INTRAVENOUS | Status: DC

## 2018-08-22 MED ORDER — ACETAMINOPHEN 10 MG/ML IV SOLN
INTRAVENOUS | Status: AC
  Filled 2018-08-22: qty 100

## 2018-08-22 MED ORDER — ONDANSETRON HCL 4 MG/2ML IJ SOLN
INTRAMUSCULAR | Status: AC
  Filled 2018-08-22: qty 2

## 2018-08-22 MED ORDER — FENTANYL CITRATE (PF) 100 MCG/2ML IJ SOLN
INTRAMUSCULAR | Status: DC | PRN
  Administered 2018-08-22: 100 ug via INTRAVENOUS
  Administered 2018-08-22: 150 ug via INTRAVENOUS

## 2018-08-22 MED ORDER — METHADONE HCL 10 MG PO TABS
40.0000 mg | ORAL_TABLET | Freq: Every day | ORAL | Status: DC
  Administered 2018-08-22: 40 mg via ORAL
  Filled 2018-08-22: qty 4

## 2018-08-22 MED ORDER — LACTATED RINGERS IV SOLN
INTRAVENOUS | Status: DC
  Administered 2018-08-22: 08:00:00 via INTRAVENOUS

## 2018-08-22 MED ORDER — SIMETHICONE 80 MG PO CHEW
80.0000 mg | CHEWABLE_TABLET | ORAL | Status: DC
  Administered 2018-08-23 – 2018-08-24 (×2): 80 mg via ORAL
  Filled 2018-08-22 (×3): qty 1

## 2018-08-22 SURGICAL SUPPLY — 33 items
BENZOIN TINCTURE PRP APPL 2/3 (GAUZE/BANDAGES/DRESSINGS) ×3 IMPLANT
CHLORAPREP W/TINT 26ML (MISCELLANEOUS) ×3 IMPLANT
CLAMP CORD UMBIL (MISCELLANEOUS) IMPLANT
CLOSURE STERI STRIP 1/2 X4 (GAUZE/BANDAGES/DRESSINGS) ×3 IMPLANT
CLOTH BEACON ORANGE TIMEOUT ST (SAFETY) ×3 IMPLANT
DRSG OPSITE POSTOP 4X10 (GAUZE/BANDAGES/DRESSINGS) ×3 IMPLANT
ELECT REM PT RETURN 9FT ADLT (ELECTROSURGICAL) ×3
ELECTRODE REM PT RTRN 9FT ADLT (ELECTROSURGICAL) ×1 IMPLANT
EXTRACTOR VACUUM M CUP 4 TUBE (SUCTIONS) IMPLANT
EXTRACTOR VACUUM M CUP 4' TUBE (SUCTIONS)
GLOVE BIOGEL PI IND STRL 7.0 (GLOVE) ×2 IMPLANT
GLOVE BIOGEL PI IND STRL 7.5 (GLOVE) ×2 IMPLANT
GLOVE BIOGEL PI INDICATOR 7.0 (GLOVE) ×4
GLOVE BIOGEL PI INDICATOR 7.5 (GLOVE) ×4
GLOVE ECLIPSE 7.5 STRL STRAW (GLOVE) ×3 IMPLANT
GOWN STRL REUS W/TWL LRG LVL3 (GOWN DISPOSABLE) ×9 IMPLANT
KIT ABG SYR 3ML LUER SLIP (SYRINGE) IMPLANT
NEEDLE HYPO 25X5/8 SAFETYGLIDE (NEEDLE) IMPLANT
NS IRRIG 1000ML POUR BTL (IV SOLUTION) ×3 IMPLANT
PACK C SECTION WH (CUSTOM PROCEDURE TRAY) ×3 IMPLANT
PAD OB MATERNITY 4.3X12.25 (PERSONAL CARE ITEMS) ×3 IMPLANT
PENCIL SMOKE EVAC W/HOLSTER (ELECTROSURGICAL) ×3 IMPLANT
RTRCTR C-SECT PINK 25CM LRG (MISCELLANEOUS) ×3 IMPLANT
STRIP CLOSURE SKIN 1/2X4 (GAUZE/BANDAGES/DRESSINGS) ×2 IMPLANT
SUT VIC AB 0 CT1 36 (SUTURE) ×3 IMPLANT
SUT VIC AB 0 CTX 36 (SUTURE) ×4
SUT VIC AB 0 CTX36XBRD ANBCTRL (SUTURE) ×2 IMPLANT
SUT VIC AB 2-0 CT1 27 (SUTURE) ×2
SUT VIC AB 2-0 CT1 TAPERPNT 27 (SUTURE) ×1 IMPLANT
SUT VIC AB 4-0 KS 27 (SUTURE) ×3 IMPLANT
TOWEL OR 17X24 6PK STRL BLUE (TOWEL DISPOSABLE) ×3 IMPLANT
TRAY FOLEY W/BAG SLVR 14FR LF (SET/KITS/TRAYS/PACK) ×3 IMPLANT
WATER STERILE IRR 1000ML POUR (IV SOLUTION) ×3 IMPLANT

## 2018-08-22 NOTE — Progress Notes (Signed)
Dr Nehemiah Settle on unit. Aware of pt's H&H. Will order type and cross for 2 units

## 2018-08-22 NOTE — Progress Notes (Signed)
Derrill Memo CNM notified of pt's request for stool softner. Order received

## 2018-08-22 NOTE — Op Note (Addendum)
[place] PROCEDURE DATE: 08/22/2018  PREOPERATIVE DIAGNOSES: Intrauterine pregnancy at [redacted]w[redacted]d weeks gestation; placental abruption   POSTOPERATIVE DIAGNOSES: The same  PROCEDURE: Primary Low Transverse Cesarean Section  SURGEON:  Dr. Laurey Arrow  ASSISTANT:  Dr. Gerri Lins  ANESTHESIOLOGY TEAM: Anesthesiologist: Josephine Igo, MD CRNA: Asher Muir, CRNA; Hewitt Blade, CRNA  INDICATIONS: Deborah Boone is a 23 y.o. G3P0020 at [redacted]w[redacted]d here for cesarean section secondary to the indications listed under preoperative diagnoses; please see preoperative note for further details.  This was a stat cesarean section, so written informed consent not obtained. Brief, verbal consent was obtained from patient when explained urgent need to proceed to OR because of vaginal bleeding, fetal bradycardia and placental abruption.   FINDINGS:  Viable female infant in cephalic presentation.  Apgars 8 and 9.  Clear amniotic fluid.  Intact placenta, three vessel cord.  Normal uterus, fallopian tubes and ovaries bilaterally. Small placental clot noted.  ANESTHESIA: General anesthesia INTRAVENOUS FLUIDS: 1000 ml   ESTIMATED BLOOD LOSS: 857 ml per Triton URINE OUTPUT:  100 ml SPECIMENS: Placenta sent to pathology COMPLICATIONS: None immediate  PROCEDURE IN DETAIL:  A STAT Cesarean section was called because of presumed placental abruption in setting of vaginal bleeding with fetal bradycardia. The patient was taken to the OR where sequential compression devices were applied to her lower extremities and foley catheter was placed. She was prepped and draped in the typical fashion. A brief timeout was performed and general anesthesia was then administered.  A Pfannenstiel skin incision was made with scalpel and carried through to the underlying layer of fascia. Ancef was given just after incision. The fascia was incised in the midline, and this incision was extended bilaterally bluntly.  The rectus  muscles were separated in the midline and the peritoneum was entered bluntly.  Attention was turned to the lower uterine segment where a low transverse hysterotomy was made with a scalpel and extended bilaterally bluntly.  The infant was successfully delivered, the cord was clamped after 30 seconds, and the infant was handed over to the awaiting neonatology team. An arterial cord blood gas was collected. Uterine massage was then administered, and the placenta delivered intact with a three-vessel cord. TXA and IM methergine administered given initially boggy uterine tone. The uterus was exteriorized and then cleared of clots and debris. The hysterotomy was closed with 0 Vicryl in a running locked fashion, and an imbricating layer was also placed with 0 Vicryl.  The pelvis was cleared of all clot and debris. Irrigation was performed until clear. Hemostasis was confirmed on all surfaces.  The uterus was returned to the abdominal cavity. The peritoneum was closed with a 0 Vicryl running stitch. The fascia was then closed using 0 Vicryl in a running fashion.  The subcutaneous layer was irrigated, and the skin was closed with a 4-0 Vicryl subcuticular stitch. The patient tolerated the procedure well. Sponge, instrument and needle counts were correct x 3.  She was taken to the recovery room in stable condition.   [name]. [name], DO Obstetrics Fellow, [facility], [place]   '

## 2018-08-22 NOTE — MAU Note (Signed)
covid swab obtained with difficulty and pt tol well. No symptoms

## 2018-08-22 NOTE — Telephone Encounter (Signed)
I called Deborah Boone and left a message I am returning your call; please call us back if you still have a question or send [place] a message.

## 2018-08-22 NOTE — Anesthesia Preprocedure Evaluation (Signed)
Anesthesia Evaluation  Patient identified by MRN, date of birth, ID band Patient awake    Reviewed: Allergy & Precautions, NPO status , Patient's Chart, lab work & pertinent test results  Airway Mallampati: II  TM Distance: >3 FB Neck ROM: Full    Dental no notable dental hx. (+) Teeth Intact   Pulmonary asthma , former smoker,    Pulmonary exam normal breath sounds clear to auscultation       Cardiovascular negative cardio ROS Normal cardiovascular exam Rhythm:Regular Rate:Normal     Neuro/Psych PSYCHIATRIC DISORDERS Anxiety Depression  Neuromuscular disease    GI/Hepatic negative GI ROS, (+)     substance abuse  alcohol use, cocaine use and marijuana use, Hx/o polysubstance abuse On Methadone maintenance   Endo/Other  negative endocrine ROS  Renal/GU negative Renal ROS  negative genitourinary   Musculoskeletal  (+) narcotic dependent  Abdominal   Peds  Hematology  (+) anemia ,   Anesthesia Other Findings   Reproductive/Obstetrics (+) Pregnancy Severe IUGR                             Anesthesia Physical Anesthesia Plan  ASA: III and emergent  Anesthesia Plan: General   Post-op Pain Management:    Induction: Intravenous, Rapid sequence and Cricoid pressure planned  PONV Risk Score and Plan: 4 or greater and Scopolamine patch - Pre-op, Midazolam, Ondansetron and Treatment may vary due to age or medical condition  Airway Management Planned: Oral ETT  Additional Equipment:   Intra-op Plan:   Post-operative Plan: Extubation in OR  Informed Consent: I have reviewed the patients History and Physical, chart, labs and discussed the procedure including the risks, benefits and alternatives for the proposed anesthesia with the patient or authorized representative who has indicated his/her understanding and acceptance.       Plan Discussed with: CRNA, Anesthesiologist and  Surgeon  Anesthesia Plan Comments:         Anesthesia Quick Evaluation

## 2018-08-22 NOTE — Anesthesia Procedure Notes (Signed)
Procedure Name: Intubation Date/Time: 08/22/2018 1:18 PM Performed by: Deborah Blade, Deborah Boone Pre-anesthesia Checklist: Patient identified, Emergency Drugs available, Suction available and Patient being monitored Patient Re-evaluated:Patient Re-evaluated prior to induction Oxygen Delivery Method: Circle system utilized Preoxygenation: Pre-oxygenation with 100% oxygen Induction Type: IV induction, Rapid sequence and Cricoid Pressure applied Ventilation: Mask ventilation without difficulty Laryngoscope Size: Mac and 3 Grade View: Grade I Tube type: Oral Tube size: 7.0 mm Number of attempts: 1 Royce Macadamia MD) Airway Equipment and Method: Stylet and Oral airway Placement Confirmation: ETT inserted through vocal cords under direct vision,  positive ETCO2 and breath sounds checked- equal and bilateral Secured at: 22 cm Tube secured with: Tape Dental Injury: Teeth and Oropharynx as per pre-operative assessment

## 2018-08-22 NOTE — Anesthesia Postprocedure Evaluation (Signed)
Anesthesia Post Note  Patient: Deborah Boone  Procedure(s) Performed: CESAREAN SECTION (N/A )     Patient location during evaluation: PACU Anesthesia Type: General Level of consciousness: awake and alert and oriented Pain management: pain level controlled Vital Signs Assessment: post-procedure vital signs reviewed and stable Respiratory status: spontaneous breathing, nonlabored ventilation and respiratory function stable Cardiovascular status: blood pressure returned to baseline and stable Postop Assessment: no apparent nausea or vomiting Anesthetic complications: no    Last Vitals:  Vitals:   08/22/18 1510 08/22/18 1515  BP: (!) 149/101 (!) 149/101  Pulse: 77 76  Resp: 16 16  Temp: 36.6 C   SpO2: 100% 100%    Last Pain:  Vitals:   08/22/18 1515  TempSrc:   PainSc: 7    Pain Goal:                   Jarvis Sawa A.

## 2018-08-22 NOTE — MAU Note (Signed)
Pt requests stool softner. States can feel stool "right there" but hurts to try to go. STates last BM was 2 days ago.

## 2018-08-22 NOTE — Progress Notes (Signed)
Patient ID: Deborah Boone, female   DOB: 06/05/1995, 23 y.o.   MRN: [id number]  Late entry:  Pt seen upon arrival to L&D. She had previously been in MAU awaiting a bed. Cervical foley was determined not to be an acceptable IOL method last evening due to closed cervix.  BP 135/90, 121/69 P 68 FHR 130s, +accels, occ mi variables Ctx irreg/mild  IUP@38 .4wks FGR Oligo Pelvic/vertebral fx 2017 Methadone-use Some borderline BPs- labs neg Anemia (hgb 7.7) Cx unfavorable  -Plan to use cytotec as IOL method to start -Anesthesia to eval pt due to hx of vertebral fx -Continue Methadone dosing  Deborah Boone [facility] 08/22/2018  9:37 AM

## 2018-08-22 NOTE — Progress Notes (Signed)
Interval Progress Note, Indications for Cesarean Section  *Late entry due to STAT nature of C/S*  Into room around 1230 to recheck cervix and place FB. Patient had received one dose of cytotec earlier in the morning and was contracting about every 1-2 minutes. FHR reactive and reassuring at time of assessment. Discussed risks/benefits of FB placement with patient who was amenable to placement. FB placed without difficulty but immediately noted blood coming out of catheter and patient started to complain about lower abdominal pain. No known previa, known posterior placenta. UDS negative on admission. FHR noted to be in 68s. Deflated foley balloon and removed and blood still noted to be coming from vagina. On recheck, no cord palpated and patient still about 1cm, intact, but with bleeding continuing. Given continued bleeding, lower abdominal pain and fetal bradycardia, decision made to call STAT C/S for presumed placental abruption. Called OB Faculty practice attendings to make them aware. RN in room called OR charge nurse to notify. Due to urgent nature, did not obtained written consent but briefly discussed need to proceed to OR with patient given noted findings. Patient in agreement. Proceeded directly to OR.   [name]. [name], DO OB/GYN Fellow

## 2018-08-22 NOTE — Progress Notes (Signed)
OB/GYN Faculty Practice: Labor Progress Note  Subjective: Patient sleeping. Plan of care discussed with RNs.  Objective: BP 109/65   Pulse 83   Temp 98.2 F (36.8 C) (Oral)   Resp 18   Ht 5' (1.524 m)   Wt 59.4 kg   LMP 11/25/2017 (Within Weeks)   BMI 25.56 kg/m  Gen: strip note Dilation: Fingertip Effacement (%): Thick Cervical Position: Middle Station: Ballotable Presentation: Vertex Exam by:: Becca McClam   Assessment and Plan: 23 y.o. [id number] [redacted]w[redacted]d here for IOL for IUGR, oligohydramnios. Patient was initially to be induced starting 08/21/18 but induction delayed because of room availability.   Labor: Induction started this morning with cytotec. Will attempt FB placement with next check.  -- pain control: open to options  on methadone 40mg  daily (ordered) -- PPH Risk: low  HgB 7.7 (2U Type and Crossed)  -- given history of pelvic fracture, anesthesia to be made aware   Fetal Well-Being: EFW 2292g (<10%) with AC<3%) at 38w3 with oligohydramnios. Cephalic by sutures on RN check.  -- Category I - continuous fetal monitoring  -- GBS negative    Frederick Marro S. [name], DO OB/GYN Fellow, Faculty Practice  12:10 PM

## 2018-08-22 NOTE — Consult Note (Signed)
Neonatology Note:   Attendance at C-section:    I was asked by Dr. Wallace to attend this stat C/S at term due to concern for abruption and fetal bradycardia. The mother is a G3P0020, GBS neg with good prenatal care complicated by h/o cocaine use, methadone use, poly substance use, IUGR and oligohydramnios now undergoing IOL. ROM 0 hours before delivery, fluid CLEAR. Infant vigorous with good spontaneous cry and tone. +60 sec DCC. Needed only minimal bulb suctioning. Ap 8/9. Lungs clear to ausc in DR. Pink.  Reasonable sized by appearence. To CN to care of Pediatrician.  Byrne Capek C. Everlean Bucher, MD  

## 2018-08-22 NOTE — Discharge Summary (Signed)
Obstetrics Discharge Summary OB/GYN Faculty Practice   Patient Name: Deborah Boone DOB: October 28, 1995 MRN: [id number]  Date of admission: 08/21/2018 Delivering MD: Laurey Arrow [place]   Date of discharge: 08/25/2018  Admitting diagnosis: oligohydramnios, fetal growth restriction Intrauterine pregnancy: [redacted]w[redacted]d     Secondary diagnosis:   Principal Problem:   Pregnancy affected by fetal growth restriction Active Problems:   Polysubstance (including opioids) dependence with physiological dependence ([place])   Limited prenatal care in second trimester   History of traumatic brain injury   History of vertebral fracture   Cocaine abuse affecting pregnancy ([place])   Methadone maintenance treatment affecting pregnancy ([place])   History of pelvic fracture   Fetal growth retardation, antenatal   Anemia   History of cesarean delivery   Oligohydramnios    Discharge diagnosis: Term Pregnancy Delivered                                            Postpartum procedures: 2U pRBC transfusion immediately post-op for bleeding and starting HgB 7.7 Complications: Placental Abruption - STAT Cesarean section   Outpatient Follow-Up: [ ]  incision check [ ]  BP check  Hospital course: Deborah Boone is a 23 y.o. [redacted]w[redacted]d who was admitted for induction of labor for oligohydramnios and fetal growth restriction. Her pregnancy was complicated by above noted. Her labor course was notable for induction starting with cytotec. Upon placement of FB, noted to have extensive vaginal bleeding and fetal HR into the 60s concerning for placental abruption. A STAT C/S was called and she was immediately taken to the OR for a pLTCS. Please see delivery/op note for additional details. She was given 2U pRBCs in recovery because of starting HgB of 7.7. Her POD#1 HgB was 11.9. Her postpartum course was otherwise uncomplicated. She was breastfeeding without difficulty. By day of discharge, she was passing flatus, urinating, eating and drinking  without difficulty. Her pain was well-controlled, and she was discharged home. She will follow-up in clinic in 1 week for BP & incision check, and 4-6 weeks for PP visit.   Physical exam  Vitals:   08/23/18 2214 08/24/18 0458 08/24/18 2203 08/25/18 0522  BP: 129/84 (!) 128/92 (!) 141/81 (!) 129/94  Pulse: 74 65 87 72  Resp: 16 16 20 16   Temp: 98.4 F (36.9 C) 98.6 F (37 C) 99 F (37.2 C) 97.9 F (36.6 C)  TempSrc: Oral Oral Oral Oral  SpO2:      Weight:      Height:       General: doing well Lochia: appropriate Uterine Fundus: firm Incision: Healing well with no significant drainage, No significant erythema, Dressing is clean, dry, and intact DVT Evaluation: No evidence of DVT seen on physical exam.  Labs: Lab Results  Component Value Date   WBC 27.7 (H) 08/23/2018   HGB 10.8 (L) 08/23/2018   HCT 33.1 (L) 08/23/2018   MCV 75.9 (L) 08/23/2018   PLT 622 (H) 08/23/2018   CMP Latest Ref Rng & Units 08/22/2018  Glucose 70 - 99 mg/dL -  BUN 6 - 20 mg/dL -  Creatinine 0.44 - 1.00 mg/dL 0.59  Sodium 135 - 145 mmol/L -  Potassium 3.5 - 5.1 mmol/L -  Chloride 98 - 111 mmol/L -  CO2 22 - 32 mmol/L -  Calcium 8.9 - 10.3 mg/dL -  Total Protein 6.5 - 8.1 g/dL -  Total  Bilirubin 0.3 - 1.2 mg/dL -  Alkaline Phos 38 - 161126 U/L -  AST 15 - 41 U/L -  ALT 0 - 44 U/L -    Discharge instructions: Per After Visit Summary and "Baby and Me Booklet"  After visit meds:  Allergies as of 08/25/2018   No Known Allergies     Medication List    STOP taking these medications   acetaminophen 500 MG tablet Commonly known as: TYLENOL     TAKE these medications   amLODipine 5 MG tablet Commonly known as: NORVASC Take 1 tablet (5 mg total) by mouth daily for 30 days.   gabapentin 300 MG capsule Commonly known as: NEURONTIN Take 300 mg by mouth at bedtime.   ibuprofen 600 MG tablet Commonly known as: ADVIL Take 1 tablet (600 mg total) by mouth every 6 (six) hours.   loratadine 10  MG tablet Commonly known as: CLARITIN Take 1 tablet (10 mg total) by mouth daily. What changed:   when to take this  reasons to take this   methadone 10 MG/ML solution Commonly known as: DOLOPHINE Take 40 mg by mouth daily.   oxyCODONE 5 MG immediate release tablet Commonly known as: Oxy IR/ROXICODONE Take 1 tablet (5 mg total) by mouth every 4 (four) hours as needed for up to 3 days for moderate pain.   Prenatal Complete 14-0.4 MG Tabs Take 1 tablet by mouth daily. (May buy from over the counter or use your home supply): Vitamin supplementation   senna-docusate 8.6-50 MG tablet Commonly known as: Senokot-S Take 2 tablets by mouth daily. Start taking on: August 26, 2018       Postpartum contraception: unsure, considering paragard Diet: Routine Diet Activity: Advance as tolerated. Pelvic rest for 6 weeks.   Follow-up Appt: Future Appointments  Date Time Provider Department Center  [facility]04[facility]2018 [facility]  09/04/2018  1:30 PM [facility] NURSE [facility]  09/19/2018  9:30 AM Donette LarryBhambri, Melanie, CNM [facility]   Follow-up Visit:No follow-ups on file.  Please schedule this patient for Postpartum visit in: 4 weeks with the following provider: Any provider For C/S patients schedule nurse incision check in weeks 2 weeks: yes High risk pregnancy complicated by: polysubstance use  oligo  FGR Delivery mode:  CS Anticipated Birth Control:  other/unsure PP Procedures needed: Incision check  Schedule Integrated BH visit: yes  Newborn Data: Live born female  Birth Weight: 5 lb 8.4 oz (2505 g) APGAR: 8, 9  Newborn Delivery   Birth date/time: 08/22/2018 12:56:00 Delivery type: C-Section, Low Transverse Trial of labor: Yes C-section categorization: Primary     Baby Feeding: Breast Disposition:home with mother    Deborah HornLawrence, Jerome Viglione, NP

## 2018-08-22 NOTE — Transfer of Care (Signed)
Immediate Anesthesia Transfer of Care Note  Patient: Deborah Boone  Procedure(s) Performed: CESAREAN SECTION (N/A )  Patient Location: PACU  Anesthesia Type:General  Level of Consciousness: sedated  Airway & Oxygen Therapy: Patient Spontanous Breathing  Post-op Assessment: Report given to RN  Post vital signs: Reviewed and stable  Last Vitals:  Vitals Value Taken Time  BP    Temp    Pulse    Resp    SpO2      Last Pain:  Vitals:   08/22/18 1202  TempSrc: Oral  PainSc:          Complications: No apparent anesthesia complications

## 2018-08-22 NOTE — Progress Notes (Signed)
Contacted provider for clarification of pain control orders on MAR. Patient only has gabapentin scheduled. Provider Fatima Blank) stated to wait until scheduled dose of gabapentin is given to see if it will help, and if patient later needs PRN dose of OxyIR she is okay with her having it so long as RN consults anesthesia to be sure no interactions from previously given medications while under general anesthesia. RN to continue to monitor.

## 2018-08-23 LAB — BPAM RBC
Blood Product Expiration Date: 202007122359
Blood Product Expiration Date: 202007122359
ISSUE DATE / TIME: 202006241427
ISSUE DATE / TIME: 202006241427
Unit Type and Rh: 6200
Unit Type and Rh: 6200

## 2018-08-23 LAB — TYPE AND SCREEN
ABO/RH(D): A POS
Antibody Screen: NEGATIVE
Unit division: 0
Unit division: 0

## 2018-08-23 LAB — CBC
HCT: 33.1 % — ABNORMAL LOW (ref 36.0–46.0)
Hemoglobin: 10.8 g/dL — ABNORMAL LOW (ref 12.0–15.0)
MCH: 24.8 pg — ABNORMAL LOW (ref 26.0–34.0)
MCHC: 32.6 g/dL (ref 30.0–36.0)
MCV: 75.9 fL — ABNORMAL LOW (ref 80.0–100.0)
Platelets: 622 10*3/uL — ABNORMAL HIGH (ref 150–400)
RBC: 4.36 MIL/uL (ref 3.87–5.11)
RDW: 19.5 % — ABNORMAL HIGH (ref 11.5–15.5)
WBC: 27.7 10*3/uL — ABNORMAL HIGH (ref 4.0–10.5)
nRBC: 0.4 % — ABNORMAL HIGH (ref 0.0–0.2)

## 2018-08-23 LAB — HCV COMMENT:

## 2018-08-23 LAB — HEPATITIS C ANTIBODY (REFLEX): HCV Ab: 0.1 s/co ratio (ref 0.0–0.9)

## 2018-08-23 MED ORDER — ONDANSETRON HCL 4 MG/2ML IJ SOLN
INTRAMUSCULAR | Status: AC
  Filled 2018-08-23: qty 2

## 2018-08-23 MED ORDER — DEXAMETHASONE SODIUM PHOSPHATE 10 MG/ML IJ SOLN
INTRAMUSCULAR | Status: AC
  Filled 2018-08-23: qty 1

## 2018-08-23 MED ORDER — PHENYLEPHRINE HCL-NACL 20-0.9 MG/250ML-% IV SOLN
INTRAVENOUS | Status: AC
  Filled 2018-08-23: qty 250

## 2018-08-23 MED ORDER — IBUPROFEN 600 MG PO TABS
600.0000 mg | ORAL_TABLET | Freq: Four times a day (QID) | ORAL | Status: DC
  Administered 2018-08-23 – 2018-08-25 (×9): 600 mg via ORAL
  Filled 2018-08-23 (×9): qty 1

## 2018-08-23 MED ORDER — ACETAMINOPHEN 325 MG PO TABS
650.0000 mg | ORAL_TABLET | Freq: Four times a day (QID) | ORAL | Status: DC | PRN
  Administered 2018-08-23 – 2018-08-25 (×6): 650 mg via ORAL
  Filled 2018-08-23 (×7): qty 2

## 2018-08-23 MED ORDER — GABAPENTIN 100 MG PO CAPS
100.0000 mg | ORAL_CAPSULE | Freq: Three times a day (TID) | ORAL | Status: DC
  Administered 2018-08-23 – 2018-08-25 (×6): 100 mg via ORAL
  Filled 2018-08-23 (×6): qty 1

## 2018-08-23 NOTE — Progress Notes (Signed)
Subjective: Postpartum Day 1: Cesarean Delivery Patient reports incisional pain, tolerating PO and + flatus.   Drinking fluids, doesn't feel like eating this am  Objective: Vital signs in last 24 hours: Temp:  [97.7 F (36.5 C)-99.3 F (37.4 C)] 99.3 F (37.4 C) (06/25 1015) Pulse Rate:  [73-87] 73 (06/25 1015) Resp:  [14-18] 16 (06/25 0410) BP: (109-149)/(65-107) 141/97 (06/25 1015) SpO2:  [97 %-100 %] 97 % (06/24 2005)  Physical Exam:  General: alert, cooperative and no distress Heart: RRR Lungs: CTAB Lochia: appropriate Uterine Fundus: firm Incision: healing well, no significant drainage, no dehiscence, no significant erythema, pressure dsg in place DVT Evaluation: No evidence of DVT seen on physical exam. Negative Homan's sign. No cords or calf tenderness. No significant calf/ankle edema. Foley to SD: clear, small  Recent Labs    08/22/18 1644 08/23/18 0447  HGB 11.9* 10.8*  HCT 37.3 33.1*   Assessment/Plan: Status post Cesarean section. Doing well postoperatively.  Continue current care. D/c foley, ambulate Start Ibuprofen Change Gabapentin to TID  Julianne Handler 08/23/2018, 11:07 AM

## 2018-08-23 NOTE — Progress Notes (Signed)
Offered to attempt to get patient up to the bathroom again, patient refused stating she is in pain again. Administered PRN Oxycodone for pain relief and provided warm compresses. Patient states she will "get some rest for a little while" and states she knows she needs to get up and moving, but wants to wait until morning. Began moaning at the thought of having to get up again, stating she "just doesn't want to right now". Informed patient of the need to continue indwelling catheter until patient able to ambulate to bathroom for the first time. Informed patient to call out if she wishes to get up before offered next time.

## 2018-08-23 NOTE — Lactation Note (Signed)
This note was copied from a baby's chart. Lactation Consultation Note Baby 70 hrs old. Formula some from bottle. Not interested in BF or bottle feeding at this time. Placed to breast in cradle position. Baby not interested in feeding. Placed nipple in mouth, wouldn't suck. Mom has everted compressible nipples.  Mom stated she has been leaking a little while pregnant. Hand expressed 5 ml colostrum. Attempted to give to baby via bottle baby wouldn't suck. Not interested. Baby has recessed chin. Discussed chin tug when latching. Noted baby gagging at times as if going to have emesis but didn't.  Baby sneezed. Discussed newborn behavior, STS, I&O, cluster feeding, supply and demand. Reviewed withdrawal symptoms. Discussed mom taking Methadone was good to BF to help baby with withdrawals. No other drugs should be taken while BF.  Mom shown how to use DEBP & how to disassemble, clean, & reassemble parts. Mom knows to pump q3h for 15-20 min. Mom encouraged to feed baby 8-12 times/24 hours and with feeding cues.  Mom encouraged to waken baby for feeds if hasn't cued in 3 hrs to feed.  High lighted Eat, sleep and console for withdrawals.  LPI information sheet given of timed feeding and supplemental amount reviewed. Mom states understanding. Mom sleepy. Will probably need reviewing again of baby care and feeding.  Encouraged to call for assistance. Give colostrum before formula and do not mix together. Lactation brochure given.  Patient Name: Deborah Boone [id number] Date: 08/23/2018 Reason for consult: Initial assessment;Infant < 6lbs;1st time breastfeeding   Maternal Data Has patient been taught Hand Expression?: Yes Does the patient have breastfeeding experience prior to this delivery?: No  Feeding    LATCH Score Latch: Too sleepy or reluctant, no latch achieved, no sucking elicited.  Audible Swallowing: None  Type of Nipple: Everted at rest and after stimulation  Comfort  (Breast/Nipple): Soft / non-tender  Hold (Positioning): Full assist, staff holds infant at breast  LATCH Score: 4  Interventions Interventions: Breast feeding basics reviewed;Adjust position;DEBP;Assisted with latch;Support pillows;Skin to skin;Position options;Breast massage;Expressed milk;Hand express;Hand pump;Breast compression  Lactation Tools Discussed/Used Tools: Pump WIC Program: Yes Pump Review: Setup, frequency, and cleaning;Milk Storage Initiated by:: Allayne Stack RN IBCLC Date initiated:: 08/23/18   Consult Status Consult Status: Follow-up Date: 08/23/18 Follow-up type: In-patient    Theodoro Kalata 08/23/2018, 4:51 AM

## 2018-08-23 NOTE — Clinical Social Work Maternal (Signed)
CLINICAL SOCIAL WORK MATERNAL/CHILD NOTE  Patient Details  Name: Deborah Boone MRN: [id number] Date of Birth: 2017-05-30  Date:  03-02-18  Clinical Social Worker Initiating Note:  Durward Fortes, LCSWA Date/Time: Initiated:  08/23/18/1140     Child's Name:  Deborah Boone   Biological Parents:  Mother, Father   Need for Interpreter:  None   Reason for Referral:  Current Substance Use/Substance Use During Pregnancy    Address:  [address], [place], [place] [address]   Phone number:  [phone] (home)     Additional phone number: none   Household Members/Support Persons (HM/SP):   Household Member/Support Person 2   HM/SP Name Relationship DOB or Age  HM/SP -1   Deborah Boone (grandmother)  grandmother   39  HM/SP -[place] (MOB) MOB  21  HM/SP -3  Deborah Boone   FOB   10/15/1989  HM/SP -4        HM/SP -5        HM/SP -6        HM/SP -7        HM/SP -8          Natural Supports (not living in the home):    none reported   Professional Supports:   [employer]   Employment: Unemployed   Type of Work:   none   Education:  Other (comment)   Homebound arranged:  n/a  Museum/gallery curator Resources:  Medicaid   Other Resources:  [facility]   Cultural/Religious Considerations Which May Impact Care:  none reported.   Strengths:  Ability to meet basic needs , Compliance with medical plan , Home prepared for child    Psychotropic Medications:       Denies   Pediatrician:     not chosen yet  Pediatrician List:   [facility]      Pediatrician Fax Number:    Risk Factors/Current Problems:  None   Cognitive State:  Insightful , Other (Comment)(MOB was apporpiate but was slightly tired from meds.)   Mood/Affect:  Relaxed , Comfortable , Interested    CSW Assessment: CSW consulted as MOB has a history of substance use while pregnant. CSW spoke with  MOB at bedside to address further needs and concerns.   Upon entering the room CSW observed that MOB was still asleep while infant was in basinet asleep. CSW woke MOB up and asked if CSW could speak with  MOB was very pleasant but was tired while speaking with CSW. CSW offered to return at a late time and Mob indicated that it was a good time. CSW understanding and congratulated MOB on the birth of infant. CSW explained to MOB the reason for the visit. MOB expressed that she is in recover from substance use at this time. 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 MOB reports that this is helpful in her getting clean. 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 MOB expressed that she lasted used substances in March 2020 and has been clean since then. MOB reported that she feels that she now has a reason to live and a reason to get clean. CSW commended MOB on her sobriety and advised her of the hospital  drug screen policy. CSW advised MOB that infants UDS was negative which was a good thing. CSW expressed to MOB that CSW would continue to monitor infants CDS for any substances. CSW advised MO that at this time CSW will not make CPS report as infant is negative. CSW also advised MOB that if infants CDS is positive then CSW would need to make a report. MOB asked CSW "will they take him from me?". CSW expressed to MOB that usually CPS tried to work plans for infant to remain in the home, however CSW wouldn't be abel to say yes or not. CSW encouraged MOB to remain clean and focus on caring for infant at best. MOB understanding and appeared to be calmed.  MOB reports that she has been through a lot of things in life. MOB expressed that she started taking Oxy at the age of 13. CSW asked MOB if it was something that MOB has been in therapy for and MOB reports that she was in therapy until the  age  of 19 but has now stopped it. MOB reports that she has a history of anxiety, depression, Bipolar, and PTSD. MOB reports that therapy has helped with this in the pasty MOB denies currently having any symptoms associated with these diagnosis.  MOB reports that she has support from her grandmother and FOB. MOB lives with both parties. MOB reports that she is currently working anywhere bit does get WIC with plans to apply for Food Stamps. CSW offered MOB other resources at this time. MOB exxpressed having all needed items to care for infnat. MOB desires for infant to sleep in basinet in her room once arrived home.  MOB was given education on PPD as well as SIDS. MOB expressed understanding with no other questions. CSW will make referrals for further resources in the home to help support MOB as needed. CSW will also monitor infants CDS to make needed CPS report if positive.    CSW Plan/Description:  No Further Intervention Required/No Barriers to Discharge, Hospital Drug Screen Policy Information, Other Information/Referral to Community Resources, CSW Will Continue to Monitor Umbilical Cord Tissue Drug Screen Results and Make Report if Warranted, Neonatal Abstinence Syndrome (NAS) Education, Sudden Infant Death Syndrome (SIDS) Education    Guy Toney S Tannor Pyon, LCSWA 08/23/2018, 12:06 PM  

## 2018-08-23 NOTE — Progress Notes (Signed)
Attempted to get patient up to bathroom at 0120; patient stated she was ready to try to get up because she knew it would be good for her to move around. Patient sat up on edge of bed with little discomfort, upon standing patient began crying stating that pain was too much. Repositioned patient back in bed. Patient stated she would be willing to try again in a few hours. Will continue to monitor pain level.

## 2018-08-23 NOTE — Progress Notes (Signed)
CSW acknowledges consult for substance sue. CSW attempted to meet with MOB at bedside, however MOB hard to arouse and very tired. CSW suggested to MOB that CSW would try back later to speak with her. MOB agreeable.        [name] S. Ramari Bray, MSW, LCSW-A [facility]  [phone]  

## 2018-08-24 ENCOUNTER — Telehealth: Payer: Self-pay | Admitting: Family Medicine

## 2018-08-24 NOTE — Progress Notes (Signed)
At [place] entered room to baby choking, mother crying. Baby recovered and settled out and placed back in crib. Patient was still very tearful and when asked what's wrong she stated she was "very stressed out". Patient stated she is in pain all the time and is scared to ask for more medication for fear of her baby being taken away. Informed mother of pain control options available. Asked patient if she has a support person to come be with her, she stated she "didn't know" and that they likely wouldn't be coming back. Social consult already completed. Offered to take infant to Nursery for mom to rest, patient declined. Stated since baby is settled she is going to try to rest with baby in the room. Informed patient to call out if any assistance is needed. Patient stated understanding.Will continue to monitor.

## 2018-08-24 NOTE — Progress Notes (Signed)
POSTPARTUM PROGRESS NOTE  POD #2  Subjective:  Deborah Boone is a 23 y.o. G3P0020 s/p pLTCS at [redacted]w[redacted]d.  She reports she doing well. No acute events overnight. She reports she is doing well. She denies any problems with ambulating, voiding or po intake. Denies nausea or vomiting. She has passed flatus. Pain is moderately controlled.  Lochia is appropriate.  Objective: Blood pressure (!) 128/92, pulse 65, temperature 98.6 F (37 C), temperature source Oral, resp. rate 16, height 5' (1.524 m), weight 59.4 kg, last menstrual period 11/25/2017, SpO2 97 %.  Physical Exam:  General: alert, cooperative and no distress Chest: no respiratory distress Heart:regular rate, distal pulses intact Abdomen: soft, nontender,  Uterine Fundus: firm, appropriately tender DVT Evaluation: No calf swelling or tenderness Extremities: No LE edema Skin: warm, dry; incision clean/dry/intact w/ pressure dressing in place  Recent Labs    08/22/18 1644 08/23/18 0447  HGB 11.9* 10.8*  HCT 37.3 33.1*    Assessment/Plan: Deborah Boone is a 23 y.o. G3P0020 s/p STAT pLTCS at [redacted]w[redacted]d for NRFHT (likely abruption).   POD#2 - Doing welll; pain moderately controlled. Discussed with pateint that she can take more pain medication if needed. Encouraged ambulation. H/H appropriate  Routine postpartum care  OOB, ambulated  Lovenox for VTE prophylaxis Contraception: Discussed that Nexplanon is covered by Medicaid and could be placed in hospital prior to discharge. She wants to research it more before deciding.  Feeding: Breast and bottle   Dispo: Plan for discharge POD #3.   LOS: 3 days   Phill Myron, D.O. OB Fellow  08/24/2018, 8:42 AM

## 2018-08-24 NOTE — Lactation Note (Signed)
This note was copied from a baby's chart. Lactation Consultation Note MD requested [facility] consult mom again.  Baby isn't feeding well, needing a lot of patience feeding on the bottle. Baby chewed on nipple, will gag sometimes. Baby would feed at intervals in long strides sounding like he was taking a lot. Baby only took 11 mls.baby spit up some of formula. Mom stated she hasn't pumped any. Hasn't been BF. Tried a couple of times. States baby wasn't interested. RN stated baby wouldn't BF.   Noted baby nasal congestion increased. Fussy Gave baby to mom to hold try to feed more.  Reported to RN.  Patient Name: Deborah Boone [id number] Date: 08/24/2018     Maternal Data    Feeding Feeding Type: Bottle Fed - Formula Nipple Type: Slow - flow  LATCH Score                   Interventions    Lactation Tools Discussed/Used     Consult Status Consult Status: Follow-up Date: 08/25/18 Follow-up type: In-patient    Deborah Boone, Elta Gu[place] 08/24/2018, 3:13 AM

## 2018-08-24 NOTE — Telephone Encounter (Signed)
Scheduled the patient for the postpartum appointment per the in basket message as the other requested appointments were already scheduled. The patient is currently hospitalized, sending an appointment reminder as the patient will also have the information on the discharge summary.

## 2018-08-25 ENCOUNTER — Encounter (HOSPITAL_COMMUNITY): Payer: Medicaid Other

## 2018-08-25 ENCOUNTER — Ambulatory Visit: Payer: Self-pay

## 2018-08-25 MED ORDER — SENNOSIDES-DOCUSATE SODIUM 8.6-50 MG PO TABS: 2.0000 | ORAL_TABLET | ORAL | 0 refills | Status: AC

## 2018-08-25 MED ORDER — AMLODIPINE BESYLATE 5 MG PO TABS: 5.0000 mg | ORAL_TABLET | Freq: Every day | ORAL | 0 refills | Status: AC

## 2018-08-25 MED ORDER — OXYCODONE HCL 5 MG PO TABS: 5.0000 mg | ORAL_TABLET | ORAL | 0 refills | Status: AC | PRN

## 2018-08-25 MED ORDER — IBUPROFEN 600 MG PO TABS: 600.0000 mg | ORAL_TABLET | Freq: Four times a day (QID) | ORAL | 0 refills | Status: AC

## 2018-08-25 NOTE — Discharge Instructions (Signed)
Cesarean Delivery, Care After This sheet gives you information about how to care for yourself after your procedure. Your health care provider may also give you more specific instructions. If you have problems or questions, contact your health care provider. What can I expect after the procedure? After the procedure, it is common to have:  A small amount of blood or clear fluid coming from the incision.  Some redness, swelling, and pain in your incision area.  Some abdominal pain and soreness.  Vaginal bleeding (lochia). Even though you did not have a vaginal delivery, you will still have vaginal bleeding and discharge.  Pelvic cramps.  Fatigue. You may have pain, swelling, and discomfort in the tissue between your vagina and your anus (perineum) if:  Your C-section was unplanned, and you were allowed to labor and push.  An incision was made in the area (episiotomy) or the tissue tore during attempted vaginal delivery. Follow these instructions at home: Incision care   Follow instructions from your health care provider about how to take care of your incision. Make sure you: ? Wash your hands with soap and water before you change your bandage (dressing). If soap and water are not available, use hand sanitizer. ? If you have a dressing, change it or remove it as told by your health care provider. ? Leave stitches (sutures), skin staples, skin glue, or adhesive strips in place. These skin closures may need to stay in place for 2 weeks or longer. If adhesive strip edges start to loosen and curl up, you may trim the loose edges. Do not remove adhesive strips completely unless your health care provider tells you to do that.  Check your incision area every day for signs of infection. Check for: ? More redness, swelling, or pain. ? More fluid or blood. ? Warmth. ? Pus or a bad smell.  Do not take baths, swim, or use a hot tub until your health care provider says it's okay. Ask your health  care provider if you can take showers.  When you cough or sneeze, hug a pillow. This helps with pain and decreases the chance of your incision opening up (dehiscing). Do this until your incision heals. Medicines  Take over-the-counter and prescription medicines only as told by your health care provider.  If you were prescribed an antibiotic medicine, take it as told by your health care provider. Do not stop taking the antibiotic even if you start to feel better.  Do not drive or use heavy machinery while taking prescription pain medicine. Lifestyle  Do not drink alcohol. This is especially important if you are breastfeeding or taking pain medicine.  Do not use any products that contain nicotine or tobacco, such as cigarettes, e-cigarettes, and chewing tobacco. If you need help quitting, ask your health care provider. Eating and drinking  Drink at least 8 eight-ounce glasses of water every day unless told not to by your health care provider. If you breastfeed, you may need to drink even more water.  Eat high-fiber foods every day. These foods may help prevent or relieve constipation. High-fiber foods include: ? Whole grain cereals and breads. ? Brown rice. ? Beans. ? Fresh fruits and vegetables. Activity   If possible, have someone help you care for your baby and help with household activities for at least a few days after you leave the hospital.  Return to your normal activities as told by your health care provider. Ask your health care provider what activities are safe for  you.  Rest as much as possible. Try to rest or take a nap while your baby is sleeping.  Do not lift anything that is heavier than 10 lbs (4.5 kg), or the limit that you were told, until your health care provider says that it is safe.  Talk with your health care provider about when you can engage in sexual activity. This may depend on your: ? Risk of infection. ? How fast you heal. ? Comfort and desire to  engage in sexual activity. General instructions  Do not use tampons or douches until your health care provider approves.  Wear loose, comfortable clothing and a supportive and well-fitting bra.  Keep your perineum clean and dry. Wipe from front to back when you use the toilet.  If you pass a blood clot, save it and call your health care provider to discuss. Do not flush blood clots down the toilet before you get instructions from your health care provider.  Keep all follow-up visits for you and your baby as told by your health care provider. This is important. Contact a health care provider if:  You have: ? A fever. ? Bad-smelling vaginal discharge. ? Pus or a bad smell coming from your incision. ? Difficulty or pain when urinating. ? A sudden increase or decrease in the frequency of your bowel movements. ? More redness, swelling, or pain around your incision. ? More fluid or blood coming from your incision. ? A rash. ? Nausea. ? Little or no interest in activities you used to enjoy. ? Questions about caring for yourself or your baby.  Your incision feels warm to the touch.  Your breasts turn red or become painful or hard.  You feel unusually sad or worried.  You vomit.  You pass a blood clot from your vagina.  You urinate more than usual.  You are dizzy or light-headed. Get help right away if:  You have: ? Pain that does not go away or get better with medicine. ? Chest pain. ? Difficulty breathing. ? Blurred vision or spots in your vision. ? Thoughts about hurting yourself or your baby. ? New pain in your abdomen or in one of your legs. ? A severe headache.  You faint.  You bleed from your vagina so much that you fill more than one sanitary pad in one hour. Bleeding should not be heavier than your heaviest period. Summary  After the procedure, it is common to have pain at your incision site, abdominal cramping, and slight bleeding from your vagina.  Check  your incision area every day for signs of infection.  Tell your health care provider about any unusual symptoms.  Keep all follow-up visits for you and your baby as told by your health care provider. This information is not intended to replace advice given to you by your health care provider. Make sure you discuss any questions you have with your health care provider. Document Released: 11/06/2001 Document Revised: 08/23/2017 Document Reviewed: 08/23/2017 Elsevier Patient Education  2020 [employer]   Contraception Choices Contraception, also called birth control, refers to methods or devices that prevent pregnancy. Hormonal methods Contraceptive implant  A contraceptive implant is a thin, plastic tube that contains a hormone. It is inserted into the upper part of the arm. It can remain in place for up to 3 years. Progestin-only injections Progestin-only injections are injections of progestin, a synthetic form of the hormone progesterone. They are given every 3 months by a health care provider. Birth control  pills  Birth control pills are pills that contain hormones that prevent pregnancy. They must be taken once a day, preferably at the same time each day. Birth control patch  The birth control patch contains hormones that prevent pregnancy. It is placed on the skin and must be changed once a week for three weeks and removed on the fourth week. A prescription is needed to use this method of contraception. Vaginal ring  A vaginal ring contains hormones that prevent pregnancy. It is placed in the vagina for three weeks and removed on the fourth week. After that, the process is repeated with a new ring. A prescription is needed to use this method of contraception. Emergency contraceptive Emergency contraceptives prevent pregnancy after unprotected sex. They come in pill form and can be taken up to 5 days after sex. They work best the sooner they are taken after having sex. Most emergency  contraceptives are available without a prescription. This method should not be used as your only form of birth control. Barrier methods Female condom  A female condom is a thin sheath that is worn over the penis during sex. Condoms keep sperm from going inside a woman's body. They can be used with a spermicide to increase their effectiveness. They should be disposed after a single use. Female condom  A female condom is a soft, loose-fitting sheath that is put into the vagina before sex. The condom keeps sperm from going inside a woman's body. They should be disposed after a single use. Diaphragm  A diaphragm is a soft, dome-shaped barrier. It is inserted into the vagina before sex, along with a spermicide. The diaphragm blocks sperm from entering the uterus, and the spermicide kills sperm. A diaphragm should be left in the vagina for 6-8 hours after sex and removed within 24 hours. A diaphragm is prescribed and fitted by a health care provider. A diaphragm should be replaced every 1-2 years, after giving birth, after gaining more than 15 lb (6.8 kg), and after pelvic surgery. Cervical cap  A cervical cap is a round, soft latex or plastic cup that fits over the cervix. It is inserted into the vagina before sex, along with spermicide. It blocks sperm from entering the uterus. The cap should be left in place for 6-8 hours after sex and removed within 48 hours. A cervical cap must be prescribed and fitted by a health care provider. It should be replaced every 2 years. Sponge  A sponge is a soft, circular piece of polyurethane foam with spermicide on it. The sponge helps block sperm from entering the uterus, and the spermicide kills sperm. To use it, you make it wet and then insert it into the vagina. It should be inserted before sex, left in for at least 6 hours after sex, and removed and thrown away within 30 hours. Spermicides Spermicides are chemicals that kill or block sperm from entering the cervix  and uterus. They can come as a cream, jelly, suppository, foam, or tablet. A spermicide should be inserted into the vagina with an applicator at least 03-50 minutes before sex to allow time for it to work. The process must be repeated every time you have sex. Spermicides do not require a prescription. Intrauterine contraception Intrauterine device (IUD) An IUD is a T-shaped device that is put in a woman's uterus. There are two types:  Hormone IUD.This type contains progestin, a synthetic form of the hormone progesterone. This type can stay in place for 3-5 years.  Copper  IUD.This type is wrapped in copper wire. It can stay in place for 10 years.  Permanent methods of contraception Female tubal ligation In this method, a woman's fallopian tubes are sealed, tied, or blocked during surgery to prevent eggs from traveling to the uterus. Hysteroscopic sterilization In this method, a small, flexible insert is placed into each fallopian tube. The inserts cause scar tissue to form in the fallopian tubes and block them, so sperm cannot reach an egg. The procedure takes about 3 months to be effective. Another form of birth control must be used during those 3 months. Female sterilization This is a procedure to tie off the tubes that carry sperm (vasectomy). After the procedure, the man can still ejaculate fluid (semen). Natural planning methods Natural family planning In this method, a couple does not have sex on days when the woman could become pregnant. Calendar method This means keeping track of the length of each menstrual cycle, identifying the days when pregnancy can happen, and not having sex on those days. Ovulation method In this method, a couple avoids sex during ovulation. Symptothermal method This method involves not having sex during ovulation. The woman typically checks for ovulation by watching changes in her temperature and in the consistency of cervical mucus. Post-ovulation method In  this method, a couple waits to have sex until after ovulation. Summary  Contraception, also called birth control, means methods or devices that prevent pregnancy.  Hormonal methods of contraception include implants, injections, pills, patches, vaginal rings, and emergency contraceptives.  Barrier methods of contraception can include female condoms, female condoms, diaphragms, cervical caps, sponges, and spermicides.  There are two types of IUDs (intrauterine devices). An IUD can be put in a woman's uterus to prevent pregnancy for 3-5 years.  Permanent sterilization can be done through a procedure for males, females, or both.  Natural family planning methods involve not having sex on days when the woman could become pregnant. This information is not intended to replace advice given to you by your health care provider. Make sure you discuss any questions you have with your health care provider. Document Released: 02/14/2005 Document Revised: 02/16/2017 Document Reviewed: 03/19/2016 Elsevier Patient Education  2020 Elsevier Inc. Hypertension During Pregnancy High blood pressure (hypertension) is when the force of blood pumping through the arteries is too strong. Arteries are blood vessels that carry blood from the heart throughout the body. Hypertension during pregnancy can be mild or severe. Severe hypertension during pregnancy (preeclampsia) is a medical emergency that requires prompt evaluation and treatment. Different types of hypertension can happen during pregnancy. These include:  Chronic hypertension. This happens when you had high blood pressure before you became pregnant, and it continues during the pregnancy. Hypertension that develops before you are [redacted] weeks pregnant and continues during the pregnancy is also called chronic hypertension. If you have chronic hypertension, it will not go away after you have your baby. You will need follow-up visits with your health care provider after you  have your baby. Your doctor may want you to keep taking medicine for your blood pressure.  Gestational hypertension. This is hypertension that develops after the 20th week of pregnancy. Gestational hypertension usually goes away after you have your baby, but your health care provider will need to monitor your blood pressure to make sure that it is getting better.  Preeclampsia. This is severe hypertension during pregnancy. This can cause serious complications for you and your baby and can also cause complications for you after the delivery of  your baby.  Postpartum preeclampsia. You may develop severe hypertension after giving birth. This usually occurs within 48 hours after childbirth but may occur up to 6 weeks after giving birth. This is rare. How does this affect me? Women who have hypertension during pregnancy have a greater chance of developing hypertension later in life or during future pregnancies. In some cases, hypertension during pregnancy can cause serious complications, such as:  Stroke.  Heart attack.  Injury to other organs, such as kidneys, lungs, or liver.  Preeclampsia.  Convulsions or seizures.  Placental abruption. How does this affect my baby? Hypertension during pregnancy can affect your baby. Your baby may:  Be born early (prematurely).  Not weigh as much as he or she should at birth (low birth weight).  Not tolerate labor well, leading to an unplanned cesarean delivery. What are the risks? There are certain factors that make it more likely for you to develop hypertension during pregnancy. These include:  Having hypertension during a previous pregnancy.  Being overweight.  Being age 22 or older.  Being pregnant for the first time.  Being pregnant with more than one baby.  Becoming pregnant using fertilization methods, such as IVF (in vitro fertilization).  Having other medical problems, such as diabetes, kidney disease, or lupus.  Having a family  history of hypertension. What can I do to lower my risk? The exact cause of hypertension during pregnancy is not known. You may be able to lower your risk by:  Maintaining a healthy weight.  Eating a healthy and balanced diet.  Following your health care provider's instructions about treating any long-term conditions that you had before becoming pregnant. It is very important to keep all of your prenatal care appointments. Your health care provider will check your blood pressure and make sure that your pregnancy is progressing as expected. If a problem is found, early treatment can prevent complications. How is this treated? Treatment for hypertension during pregnancy varies depending on the type of hypertension you have and how serious it is.  If you were taking medicine for high blood pressure before you became pregnant, talk with your health care provider. You may need to change medicine during pregnancy because some medicines, like ACE inhibitors, may not be considered safe for your baby.  If you have gestational hypertension, your health care provider may order medicine to treat this during pregnancy.  If you are at risk for preeclampsia, your health care provider may recommend that you take a low-dose aspirin during your pregnancy.  If you have severe hypertension, you may need to be hospitalized so you and your baby can be monitored closely. You may also need to be given medicine to lower your blood pressure. This medicine may be given by mouth or through an IV.  In some cases, if your condition gets worse, you may need to deliver your baby early. Follow these instructions at home: Eating and drinking   Drink enough fluid to keep your urine pale yellow.  Avoid caffeine. Lifestyle  Do not use any products that contain nicotine or tobacco, such as cigarettes, e-cigarettes, and chewing tobacco. If you need help quitting, ask your health care provider.  Do not use alcohol or  drugs.  Avoid stress as much as possible.  Rest and get plenty of sleep.  Regular exercise can help to reduce your blood pressure. Ask your health care provider what kinds of exercise are best for you. General instructions  Take over-the-counter and prescription medicines only as  told by your health care provider.  Keep all prenatal and follow-up visits as told by your health care provider. This is important. Contact a health care provider if:  You have symptoms that your health care provider told you may require more treatment or monitoring, such as: ? Headaches. ? Nausea or vomiting. ? Abdominal pain. ? Dizziness. ? Light-headedness. Get help right away if:  You have: ? Severe abdominal pain that does not get better with treatment. ? A severe headache that does not get better. ? Vomiting that does not get better. ? Sudden, rapid weight gain. ? Sudden swelling in your hands, ankles, or face. ? Vaginal bleeding. ? Blood in your urine. ? Blurred or double vision. ? Shortness of breath or chest pain. ? Weakness on one side of your body. ? Difficulty speaking.  Your baby is not moving as much as usual. Summary  High blood pressure (hypertension) is when the force of blood pumping through the arteries is too strong.  Hypertension during pregnancy can cause problems for you and your baby.  Treatment for hypertension during pregnancy varies depending on the type of hypertension you have and how serious it is.  Keep all prenatal and follow-up visits as told by your health care provider. This is important. This information is not intended to replace advice given to you by your health care provider. Make sure you discuss any questions you have with your health care provider. Document Released: 11/02/2010 Document Revised: 06/07/2018 Document Reviewed: 03/13/2018 Elsevier Patient Education  2020 [employer]

## 2018-08-25 NOTE — Lactation Note (Signed)
This note was copied from a baby's chart. Lactation Consultation Note  Patient Name: Deborah Boone [id number] Date: 08/25/2018 Reason for consult: Infant weight loss;1st time breastfeeding P1, 98 hour female infant,  ETI, less than 6 lbs, weight loss -8%. Infant is eat, sleep, console. LC entered room dad was giving  infant 10 ml of Similac 24 kcal formula with iron. Mom attempted to latch infant on left breast using cross cradle hold, infant holding breast in mouth but not suckling probably due dad offering 10 ml of formula prior to latching infant to breast. Per mom, she does want to breastfeed infant. Mom milk has transition to mature milk, she had breast fullness with  leaking. [place] asked mom to use DEBP, per mom she just started pumping today and had  pumped once.  Mom pumped 90+ ml of EBM. LC discussed feeding amounts based on infant age, that 10 ml is not enough, infant took 10 ml of EBM , LC discussed burping between feeding and pace feeding. LC reviewed guidelines at 72+ hours if infant is not latching to breast infant should have 30-60 ml per feeding. LC gave mom bottles to put EBM in , she has two bottles of 30 ml of EBM to give to infant. LC discussed breast milk storage guidelines and how to warm EBM if refrigerated    Mom's plan:  1. Mom will attempt to latch infant on breast first at next feeding. 2. She will offer infant 57ml of EBM and then offer 24 kcal formula. 3. Mom will pump every 3 hours for 15 minutes.  Maternal Data    Feeding    LATCH Score Latch: Too sleepy or reluctant, no latch achieved, no sucking elicited.  Audible Swallowing: None  Type of Nipple: Everted at rest and after stimulation  Comfort (Breast/Nipple): Soft / non-tender  Hold (Positioning): Assistance needed to correctly position infant at breast and maintain latch.  LATCH Score: 5  Interventions Interventions: Skin to skin;DEBP;Adjust position;Support pillows;Position options;Expressed  milk  Lactation Tools Discussed/Used     Consult Status Consult Status: Follow-up Date: 08/26/18 Follow-up type: In-patient    Vicente Serene 08/25/2018, 10:50 PM

## 2018-08-26 ENCOUNTER — Ambulatory Visit: Payer: Self-pay

## 2018-08-26 NOTE — Lactation Note (Addendum)
This note was copied from a baby's chart. Lactation Consultation Note  Patient Name: Deborah Boone [id number] Date: 08/26/2018 Reason for consult: Other (Comment);Follow-up assessment;Early term 37-38.6wks;Infant < 6lbs;Infant weight loss(baby ESC -  milk is in increased wt loss - 9 %)  Baby is 11 days old  As LC entered the room , baby very fussy and mom in the middle of changing a yellow stool loose to soft diaper.  After the diaper baby didn't settle down and LC recommended since the baby only fed  For 10 ml and supplemented  Only 17 ml , to feed the baby 25 ml of EBM.  Per mom has been using the green nipple with success and baby was doing well on the gold nipple , but seems to better on the green nipple and feedings take about 15 mins.  LC watched mom get started to feed with the green nipple and had to direct mom to aim the nipple towards the roof of the  Mouth and allow the baby's mouth to mold around the nipple. Once that happened baby was able to get into a consistent pattern and mom still feeding.  LC also recommended since baby is fussy at times, before every feeding check the diaper if needed and change,  Burp the baby , may burp, way to mover the gas down so the baby is more comfortable feeding and precede to feed the  Baby.  Mom and dad aware of the volume baby should take for a feeding 45-60 ml if the baby doesn't breast feed.  If the baby breast feeds 15 -20 mins max and then supplement with at least 30 ml of EBM 1st , if not available formula 24 cal.   LC highly stressed it is important to take the baby to the breast calmly , will latch better, give an appetizer if needed.   Report of the [facility] consult reported to the NP - [place]     Maternal Data    Feeding Feeding Type: Breast Milk(LC showed mom how deep on the nipple baby's mouth should be) Nipple Type: Slow - flow(per mom has been using the green nipple - works even better than the gold)  LATCH  Score                   Interventions Interventions: Breast feeding basics reviewed  Lactation Tools Discussed/Used Tools: Pump Breast pump type: Double-Electric Breast Pump Pump Review: Milk Storage   Consult Status Consult Status: Follow-up Date: 08/27/18 Follow-up type: In-patient    [place] 08/26/2018, 2:37 PM

## 2018-08-27 ENCOUNTER — Ambulatory Visit: Payer: Self-pay

## 2018-08-27 NOTE — Lactation Note (Signed)
This note was copied from a baby's chart. Lactation Consultation Note  Patient Name: Deborah Boone [id number] Date: 08/27/2018   Mom is not interested in a [facility] loaner b/c she does not have $30. However, Mom was shown how to assemble & use hand pump (single- & double-mode) that was included in pump kit.   In the note from the SLP consult, the SLP recommended the Ultra Preemie nipple or the Enfamil Extra Slow-Flow disposable nipple. The only disposable nipple seen in room was the Enfamil slow-flow nipples. Mom said she had not been given any Enfamil Extra Slow-Flow nipples to try; I gave Mom some.  I told her that if infant is fine with the Enfamil Extra Slow-Flow nipples, then infant should be ok with "newborn" level nipples. Mom says she doesn't know what kind of bottles she has at home, but has a lot. I cautioned Mom against using any bottle brand where infant's swallows sounded too loud.   I also discussed sanitizing breast pump parts once/day & sanitizing the clear portion of Ultra Preemie bottle teat.   Deborah Boone [name] 08/27/2018, 11:25 AM

## 2018-08-30 ENCOUNTER — Telehealth: Payer: Self-pay | Admitting: Family Medicine

## 2018-08-30 NOTE — Telephone Encounter (Signed)
Attempted to call patient about her appointment on 7/6 @ 10:20. No answer, number rang busy and could not leave a voicemail.

## 2018-09-03 ENCOUNTER — Telehealth: Payer: Self-pay | Admitting: Family Medicine

## 2018-09-03 ENCOUNTER — Telehealth: Payer: Medicaid Other

## 2018-09-03 NOTE — Telephone Encounter (Signed)
Attempted to call about her missed appointment on 7/7 @ 10:20. No answer, and the number rang busy. Mychart message sent.

## 2018-09-04 ENCOUNTER — Ambulatory Visit: Payer: Medicaid Other | Admitting: Clinical

## 2018-09-04 ENCOUNTER — Telehealth: Payer: Medicaid Other | Admitting: General Practice

## 2018-09-04 ENCOUNTER — Encounter: Payer: Self-pay | Admitting: *Deleted

## 2018-09-04 ENCOUNTER — Other Ambulatory Visit: Payer: Self-pay

## 2018-09-04 DIAGNOSIS — O0990 Supervision of high risk pregnancy, unspecified, unspecified trimester: Secondary | ICD-10-CM

## 2018-09-04 NOTE — BH Specialist Note (Signed)
Pt did not arrive to Webex video visit, and the phone had a busy signal at both attempts to call; left MyChart message for patient.   Deborah Boone via Telemedicine Video Visit  09/04/2018 CHEYANE AYON [id number]  Garlan Fair

## 2018-09-04 NOTE — Progress Notes (Signed)
Attempted to call patient for BP check appt, phone rang busy.   Attempted to call several other times, phone rang busy each time.   Koren Bound RN BSN 09/04/18

## 2018-09-05 ENCOUNTER — Telehealth: Payer: Self-pay | Admitting: Obstetrics & Gynecology

## 2018-09-05 NOTE — Telephone Encounter (Signed)
The patient called in to reschedule the missed appointment. The patient also stated she does not have an Internet and can only complete televisits at this time.

## 2018-09-06 ENCOUNTER — Ambulatory Visit: Payer: Medicaid Other | Admitting: Clinical

## 2018-09-06 ENCOUNTER — Telehealth: Payer: Self-pay | Admitting: Obstetrics & Gynecology

## 2018-09-06 ENCOUNTER — Other Ambulatory Visit: Payer: Self-pay

## 2018-09-06 NOTE — BH Specialist Note (Signed)
Pt did not arrive to Webex video and did not answer the phone; Left HIPPA-compliant message to call back [name] from [facility] [employer] at [phone], and left MyChart message for patient.   [name] via Telemedicine Video Visit  09/06/2018 Deborah Boone [id number]  Garlan Fair

## 2018-09-06 NOTE — Telephone Encounter (Signed)
Mailing the patient a missed appointment letter for missed appointment with [name].

## 2018-09-11 ENCOUNTER — Ambulatory Visit: Payer: Medicaid Other

## 2018-09-19 ENCOUNTER — Ambulatory Visit (INDEPENDENT_AMBULATORY_CARE_PROVIDER_SITE_OTHER): Payer: Medicaid Other | Admitting: Certified Nurse Midwife

## 2018-09-19 ENCOUNTER — Encounter: Payer: Self-pay | Admitting: Certified Nurse Midwife

## 2018-09-19 ENCOUNTER — Other Ambulatory Visit: Payer: Self-pay

## 2018-09-19 VITALS — BP 110/74 | Ht 61.0 in | Wt 120.2 lb

## 2018-09-19 DIAGNOSIS — Z1389 Encounter for screening for other disorder: Secondary | ICD-10-CM | POA: Diagnosis not present

## 2018-09-19 DIAGNOSIS — L089 Local infection of the skin and subcutaneous tissue, unspecified: Secondary | ICD-10-CM

## 2018-09-19 DIAGNOSIS — O8600 Infection of obstetric surgical wound, unspecified: Secondary | ICD-10-CM

## 2018-09-19 DIAGNOSIS — T148XXA Other injury of unspecified body region, initial encounter: Secondary | ICD-10-CM

## 2018-09-19 MED ORDER — SULFAMETHOXAZOLE-TRIMETHOPRIM 800-160 MG PO TABS: 1.0000 | ORAL_TABLET | Freq: Two times a day (BID) | ORAL | 0 refills | Status: AC

## 2018-09-19 NOTE — Progress Notes (Signed)
Pt scored 8 on [place], pt also states c-section incision is swollen & hurts.

## 2018-09-19 NOTE — Progress Notes (Signed)
Subjective:     Deborah Boone is a 23 y.o. female who presents for a postpartum visit. She is 4 weeks postpartum following a low cervical transverse Cesarean section. I have fully reviewed the prenatal and intrapartum course. The delivery was at 38/4 gestational weeks. Outcome: primary cesarean section, low transverse incision for abruption. Anesthesia: general. Postpartum course has been uncomplicated. Baby's course has been uncomplicated. Baby is feeding by bottle - Carnation Good Start. Bleeding no bleeding. Bowel function is normal. Bladder function is normal. Patient is sexually active. Contraception method is condoms. Postpartum depression screening: negative. Reports yellow drainage from incision. No fevers. The following portions of the patient's history were reviewed and updated as appropriate: allergies, current medications, past family history, past medical history, past social history, past surgical history and problem list.  Review of Systems Pertinent items are noted in HPI.   Objective:    BP 110/74 (BP Location: Right Arm, Patient Position: Sitting)   Ht 5\' 1"  (1.549 m)   Wt 120 lb 3.2 oz (54.5 kg)   LMP 11/25/2017 (Within Weeks)   Breastfeeding No   BMI 22.71 kg/m   General:  alert, cooperative and no distress   Breasts:  not examined  Lungs: RWOB  Heart:  normal rate  Abdomen: Soft, NT   Vulva:  n/a  Vagina: n/a  Cervix:  n/a  Corpus: n/a  Adnexa:  n/a  Rectal Exam: n/a       Skin: low transverse abd incision; suture remnants protruding from left lateral, 1.5cm firm area to right lateral draining clear yellow fluid Media Information    Document Information  Photos    09/19/2018 10:42  Attached To:  Postpartum Visit on 09/19/18 with Julianne Handler, CNM  Source Information  Julianne Handler, CNM  [facility]    Assessment:  Post-op wound infection- evaluated by Dr. Ilda Basset, recommends abx Contraceptive counseling Postpartum exam  Plan:    1.  Contraception:  would like Nexplanon or Liletta- discussed both and brochures given, does not want today, will return for placement 2. Follow up in: 5 days or as needed.   3. Needs pap when she returns for [facility] 4. Rx Bactrim

## 2018-09-25 ENCOUNTER — Ambulatory Visit: Payer: Medicaid Other | Admitting: Obstetrics & Gynecology

## 2018-09-28 ENCOUNTER — Ambulatory Visit: Payer: Medicaid Other | Admitting: Obstetrics & Gynecology

## 2018-09-28 ENCOUNTER — Encounter: Payer: Self-pay | Admitting: Family Medicine

## 2018-10-11 ENCOUNTER — Emergency Department (HOSPITAL_COMMUNITY)
Admission: EM | Admit: 2018-10-11 | Discharge: 2018-10-30 | Disposition: E | Payer: Medicaid Other | Attending: Emergency Medicine | Admitting: Emergency Medicine

## 2018-10-11 DIAGNOSIS — I468 Cardiac arrest due to other underlying condition: Secondary | ICD-10-CM | POA: Diagnosis not present

## 2018-10-11 DIAGNOSIS — Y939 Activity, unspecified: Secondary | ICD-10-CM | POA: Diagnosis not present

## 2018-10-11 DIAGNOSIS — Y999 Unspecified external cause status: Secondary | ICD-10-CM | POA: Insufficient documentation

## 2018-10-11 DIAGNOSIS — Y92410 Unspecified street and highway as the place of occurrence of the external cause: Secondary | ICD-10-CM | POA: Diagnosis not present

## 2018-10-11 DIAGNOSIS — S29001A Unspecified injury of muscle and tendon of front wall of thorax, initial encounter: Secondary | ICD-10-CM | POA: Insufficient documentation

## 2018-10-11 LAB — PREPARE FRESH FROZEN PLASMA: Unit division: 0

## 2018-10-11 LAB — BPAM FFP
Blood Product Expiration Date: 202008162359
Blood Product Expiration Date: 202008162359
ISSUE DATE / TIME: 202008131018
ISSUE DATE / TIME: 202008131018
Unit Type and Rh: 6200
Unit Type and Rh: 6200

## 2018-10-11 LAB — BPAM RBC
Blood Product Expiration Date: 202008202359
Blood Product Expiration Date: 202008222359
ISSUE DATE / TIME: 202008131018
ISSUE DATE / TIME: 202008131018
Unit Type and Rh: 9500
Unit Type and Rh: 9500

## 2018-10-30 NOTE — Progress Notes (Signed)
I offered grief support over a period of several hours to the pt's family, including pt's SO, brother, sister-in-law, and baby.    [place], Bcc Pager, [phone] 1:10 PM
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
## 2018-10-30 NOTE — Progress Notes (Signed)
Assistance chaplain colleague with supporting family. Family did not know funeral home .  Brother was given Patient Placement card with instructions on how to proceed. Brother is next of kind.   His contact information is ; Deborah Boone [phone] [address]  [name], [place], [facility], Pager [phone]

## 2018-10-30 NOTE — ED Notes (Signed)
Deborah Boone- Brother of patient  [phone]  [address] [place]

## 2018-10-30 NOTE — ED Notes (Signed)
[place] donor called
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
## 2018-10-30 DEATH — deceased

## 2018-12-13 ENCOUNTER — Encounter: Payer: Self-pay | Admitting: *Deleted
